# Patient Record
Sex: Male | Born: 1975 | Race: White | Hispanic: No | Marital: Married | Smoking: Never smoker
Health system: Southern US, Community
[De-identification: ages and names within clinical notes are randomized; demographics above are authoritative.]

## PROBLEM LIST (undated history)

## (undated) DIAGNOSIS — E785 Hyperlipidemia, unspecified: Secondary | ICD-10-CM

## (undated) DIAGNOSIS — G4730 Sleep apnea, unspecified: Secondary | ICD-10-CM

## (undated) HISTORY — DX: Sleep apnea, unspecified: G47.30

## (undated) HISTORY — DX: Hyperlipidemia, unspecified: E78.5

## (undated) HISTORY — PX: APPENDECTOMY: SHX54

---

## 2008-01-06 ENCOUNTER — Ambulatory Visit: Payer: Self-pay | Admitting: Family Medicine

## 2008-07-21 ENCOUNTER — Emergency Department: Payer: Self-pay | Admitting: Emergency Medicine

## 2008-07-21 ENCOUNTER — Other Ambulatory Visit: Payer: Self-pay

## 2008-07-26 ENCOUNTER — Ambulatory Visit: Payer: Self-pay | Admitting: Family Medicine

## 2008-07-26 ENCOUNTER — Other Ambulatory Visit: Payer: Self-pay

## 2009-07-16 IMAGING — CR DG CHEST 2V
1 series · 2 of 2 positions shown · non-contrast
Comparison: none

REASON FOR EXAM: Chest tightness
COMMENTS:

PROCEDURE:     MDR - MDR CHEST PA(OR AP) AND LATERAL  - July 26, 2008  [DATE]
RESULT:     The lungs are clear. The cardiac silhouette and visualized bony
skeleton are unremarkable.

[Series 1: view not recorded · 0.17mm/px · 2 of 2 slices shown]
[im 1/2]
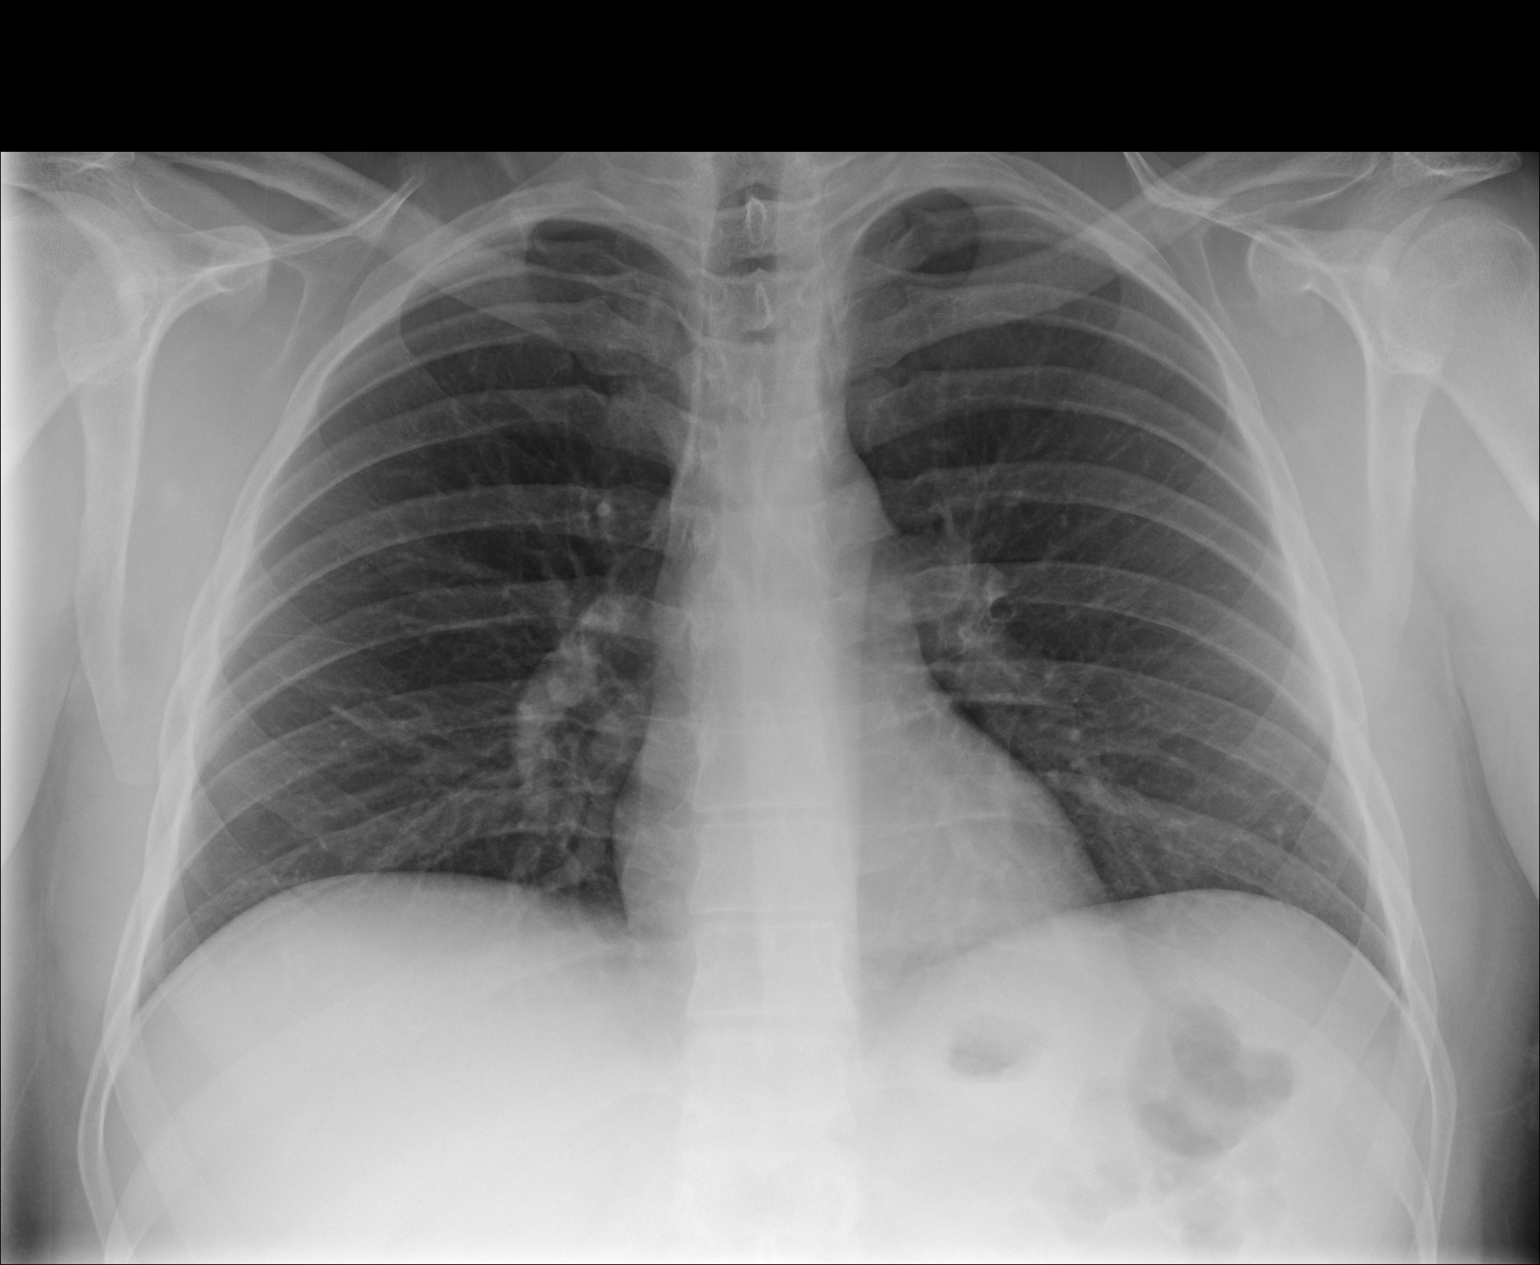
[im 2/2]
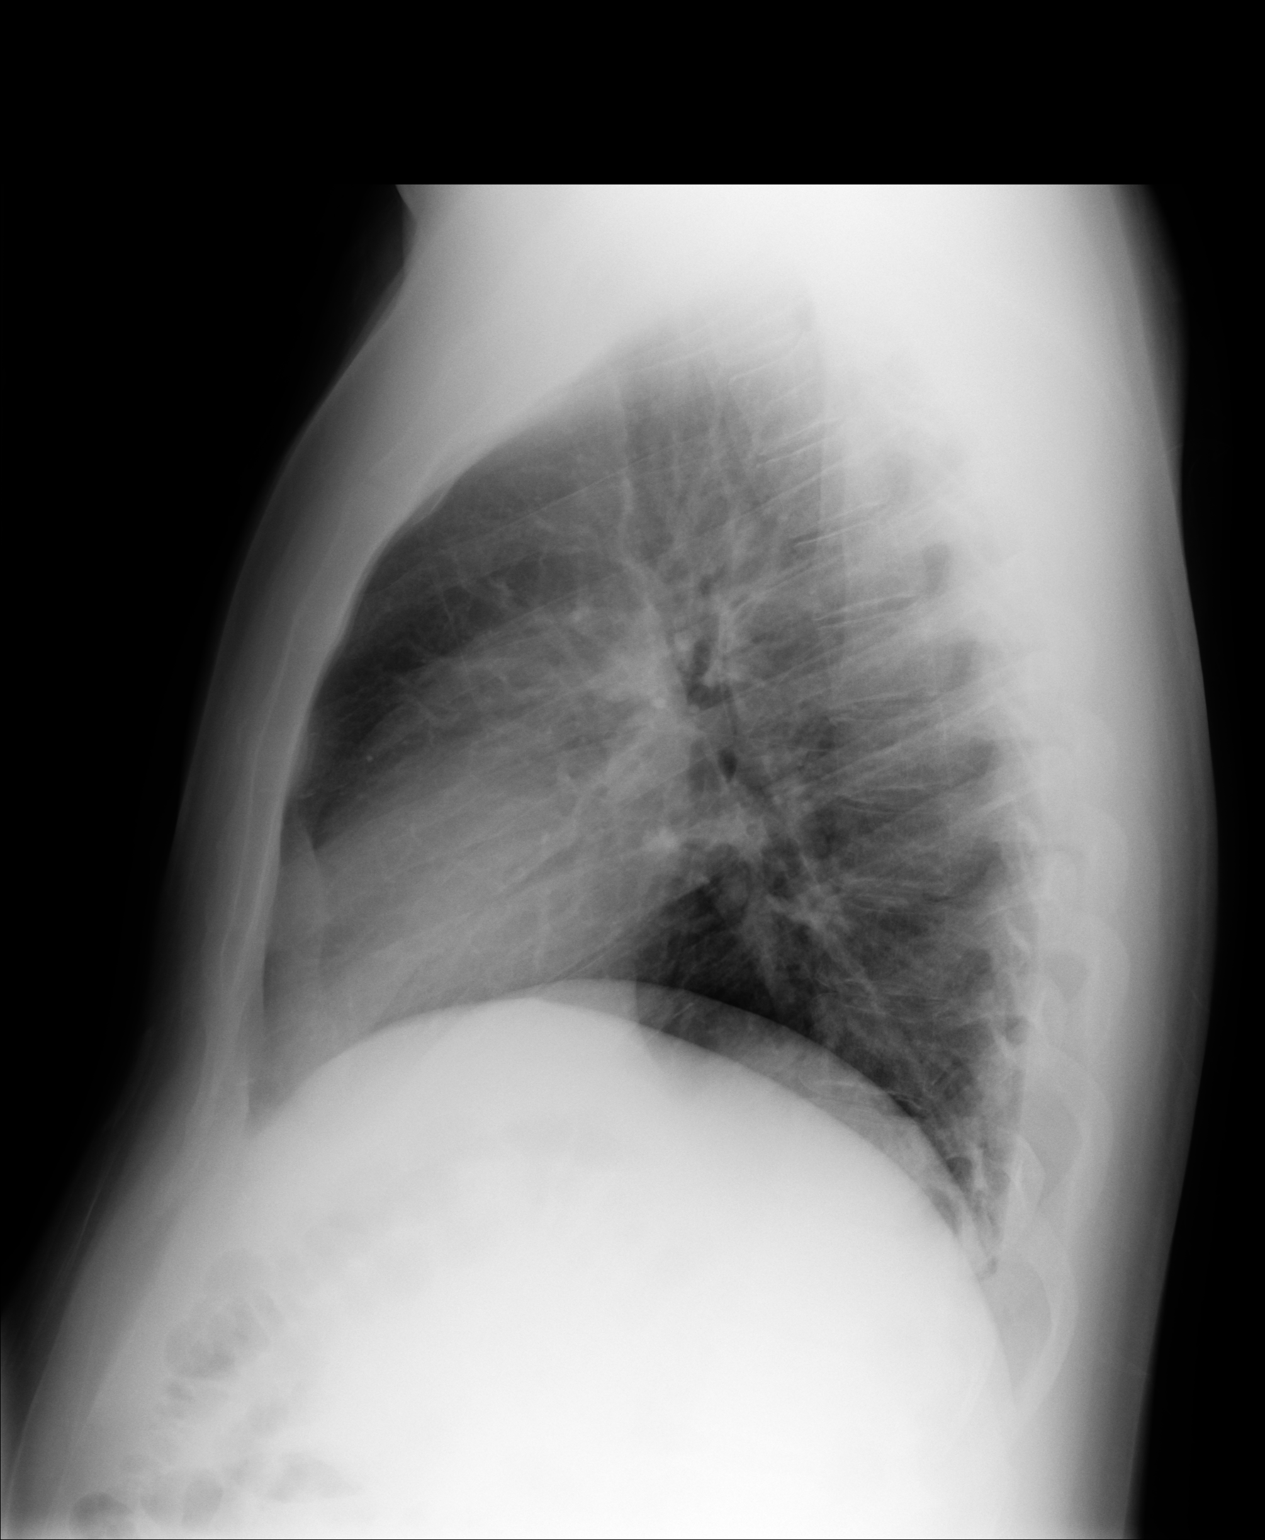

[2 of 2 positions shown; findings below may reference images not displayed]

IMPRESSION: Chest radiograph without evidence of acute cardiopulmonary
disease.

## 2010-09-29 ENCOUNTER — Ambulatory Visit: Payer: Self-pay | Admitting: Internal Medicine

## 2014-10-10 LAB — TSH: TSH: 1.39 u[IU]/mL (ref 0.41–5.90)

## 2014-10-10 LAB — BASIC METABOLIC PANEL
BUN: 12 mg/dL (ref 4–21)
Creatinine: 1 mg/dL (ref 0.6–1.3)

## 2014-10-10 LAB — LIPID PANEL
CHOLESTEROL: 227 mg/dL — AB (ref 0–200)
HDL: 37 mg/dL (ref 35–70)
LDL Cholesterol: 151 mg/dL
TRIGLYCERIDES: 195 mg/dL — AB (ref 40–160)

## 2014-10-10 LAB — CBC AND DIFFERENTIAL: HEMOGLOBIN: 15 g/dL (ref 13.5–17.5)

## 2015-02-25 DIAGNOSIS — G4733 Obstructive sleep apnea (adult) (pediatric): Secondary | ICD-10-CM | POA: Insufficient documentation

## 2015-03-08 ENCOUNTER — Ambulatory Visit: Payer: Self-pay | Admitting: Internal Medicine

## 2015-04-17 ENCOUNTER — Ambulatory Visit: Admit: 2015-04-17 | Disposition: A | Discharge status: Home / Self Care | Payer: Self-pay | Admitting: Internal Medicine

## 2015-12-04 ENCOUNTER — Encounter: Payer: Self-pay | Admitting: Internal Medicine

## 2015-12-04 ENCOUNTER — Other Ambulatory Visit: Payer: Self-pay | Admitting: Internal Medicine

## 2015-12-04 DIAGNOSIS — H6990 Unspecified Eustachian tube disorder, unspecified ear: Secondary | ICD-10-CM | POA: Insufficient documentation

## 2015-12-04 DIAGNOSIS — H6980 Other specified disorders of Eustachian tube, unspecified ear: Secondary | ICD-10-CM | POA: Insufficient documentation

## 2016-12-25 ENCOUNTER — Encounter: Payer: Self-pay | Admitting: Internal Medicine

## 2017-04-26 ENCOUNTER — Encounter: Payer: Self-pay | Admitting: Internal Medicine

## 2017-04-26 DIAGNOSIS — E782 Mixed hyperlipidemia: Secondary | ICD-10-CM | POA: Insufficient documentation

## 2017-04-27 ENCOUNTER — Ambulatory Visit (INDEPENDENT_AMBULATORY_CARE_PROVIDER_SITE_OTHER): Payer: BLUE CROSS/BLUE SHIELD | Admitting: Internal Medicine

## 2017-04-27 ENCOUNTER — Encounter: Payer: Self-pay | Admitting: Internal Medicine

## 2017-04-27 VITALS — BP 122/78 | HR 80 | Ht 68.0 in | Wt 202.4 lb

## 2017-04-27 DIAGNOSIS — Z0000 Encounter for general adult medical examination without abnormal findings: Secondary | ICD-10-CM | POA: Diagnosis not present

## 2017-04-27 DIAGNOSIS — H6980 Other specified disorders of Eustachian tube, unspecified ear: Secondary | ICD-10-CM | POA: Diagnosis not present

## 2017-04-27 DIAGNOSIS — E782 Mixed hyperlipidemia: Secondary | ICD-10-CM | POA: Diagnosis not present

## 2017-04-27 DIAGNOSIS — G4733 Obstructive sleep apnea (adult) (pediatric): Secondary | ICD-10-CM | POA: Diagnosis not present

## 2017-04-27 DIAGNOSIS — B354 Tinea corporis: Secondary | ICD-10-CM | POA: Insufficient documentation

## 2017-04-27 LAB — POC URINALYSIS WITH MICROSCOPIC (NON AUTO)MANUAL RESULT
Bilirubin, UA: NEGATIVE
Glucose, UA: NEGATIVE
Ketones, UA: NEGATIVE
LEUKOCYTES UA: NEGATIVE
Mucus, UA: 0
Nitrite, UA: NEGATIVE
PH UA: 7.5 (ref 5.0–8.0)
Protein, UA: NEGATIVE
RBC: 0 M/uL — AB (ref 4.69–6.13)
UROBILINOGEN UA: 0.2 U/dL
WBC CASTS UA: 0

## 2017-04-27 NOTE — Progress Notes (Signed)
Date:  04/27/2017   Name:  Christian Chavez   DOB:  1976/01/23   MRN:  [id number]   Chief Complaint: Annual Exam Christian Chavez is a 41 y.o. male who presents today for his Complete Annual Exam. He feels well. He reports exercising none. He reports he is sleeping well.   Hyperlipidemia  Pertinent negatives include no chest pain, myalgias or shortness of breath.  Rash  This is a new problem. Pertinent negatives include no diarrhea, fatigue or shortness of breath.  Allergies - ear congestion and nasal congestion due to environmental agents.  Sx well controlled with flonase. OSA - now on auto PAP nightly and doing well. He uses is nightly and feels rested with no more AM headaches.   Review of Systems  Constitutional: Negative for appetite change, chills, diaphoresis, fatigue and unexpected weight change.  HENT: Negative for hearing loss, tinnitus, trouble swallowing and voice change.   Eyes: Negative for visual disturbance.  Respiratory: Negative for choking, shortness of breath and wheezing.   Cardiovascular: Positive for palpitations. Negative for chest pain and leg swelling.  Gastrointestinal: Negative for abdominal pain, blood in stool, constipation and diarrhea.  Genitourinary: Negative for difficulty urinating, dysuria and frequency.  Musculoskeletal: Positive for arthralgias (left ankle). Negative for back pain and myalgias.  Skin: Negative for color change and rash.  Allergic/Immunologic: Negative for environmental allergies and food allergies.  Neurological: Negative for dizziness, syncope and headaches.  Hematological: Negative for adenopathy.  Psychiatric/Behavioral: Negative for dysphoric mood and sleep disturbance.    Patient Active Problem List   Diagnosis Date Noted  . Hyperlipidemia, mixed 04/26/2017  . Dysfunction of eustachian tube 12/04/2015  . OSA (obstructive sleep apnea) 02/25/2015    Prior to Admission medications   Medication Sig Start Date End Date Taking?  Authorizing Provider  fluticasone (FLONASE) 50 MCG/ACT nasal spray Place 2 sprays into the nose daily. 09/11/14  Yes Historical Provider, MD    No Known Allergies  Past Surgical History:  Procedure Laterality Date  . APPENDECTOMY      Social History  Substance Use Topics  . Smoking status: Never Smoker  . Smokeless tobacco: Never Used  . Alcohol use 0.0 oz/week     Comment: ocassional   No flowsheet data found.    Medication list has been reviewed and updated.   Physical Exam  Constitutional: He is oriented to person, place, and time. He appears well-developed and well-nourished.  HENT:  Head: Normocephalic.  Right Ear: Tympanic membrane, external ear and ear canal normal.  Left Ear: Tympanic membrane, external ear and ear canal normal.  Nose: Nose normal.  Mouth/Throat: Uvula is midline and oropharynx is clear and moist.  Eyes: Conjunctivae and EOM are normal. Pupils are equal, round, and reactive to light.  Neck: Normal range of motion. Neck supple. Carotid bruit is not present. No thyromegaly present.  Cardiovascular: Normal rate, regular rhythm, normal heart sounds and intact distal pulses.   Pulmonary/Chest: Effort normal and breath sounds normal. He has no wheezes. Right breast exhibits no mass. Left breast exhibits no mass.  Abdominal: Soft. Normal appearance and bowel sounds are normal. There is no hepatosplenomegaly. There is no tenderness.  Musculoskeletal: Normal range of motion.       Left ankle: He exhibits normal range of motion and no swelling. No tenderness. Achilles tendon normal.  Lymphadenopathy:    He has no cervical adenopathy.  Neurological: He is alert and oriented to person, place, and time. He has normal  reflexes.  Skin: Skin is warm and dry. Rash noted.  Tinea rash in both axilla  Psychiatric: He has a normal mood and affect. His speech is normal and behavior is normal. Judgment and thought content normal.  Nursing note and vitals  reviewed.   BP 122/78 (BP Location: Right Arm, Patient Position: Sitting, Cuff Size: Normal)   Pulse 80   Ht  (1.727 m)   Wt 202 lb 6.4 oz (91.8 kg)   SpO2 98%   BMI 30.77 kg/m   Assessment and Plan: 1. Annual physical exam UA micro negative for RBC - POC urinalysis w microscopic (non auto)  2. Hyperlipidemia, mixed Advise if medication is needed Resume exercise - Lipid panel  3. Dysfunction of Eustachian tube, unspecified laterality Continue flonase  4. Tinea corporis Use topical otc antifungal - Comprehensive metabolic panel  5. OSA (obstructive sleep apnea) Doing well with good compliance - CBC with Differential/Platelet   No orders of the defined types were placed in this encounter.   Bari Edward, MD [facility]  04/27/2017

## 2017-04-27 NOTE — Patient Instructions (Signed)
 Health Maintenance, Male A healthy lifestyle and preventive care is important for your health and wellness. Ask your health care provider about what schedule of regular examinations is right for you. What should I know about weight and diet?  Eat a Healthy Diet  Eat plenty of vegetables, fruits, whole grains, low-fat dairy products, and lean protein.  Do not eat a lot of foods high in solid fats, added sugars, or salt. Maintain a Healthy Weight  Regular exercise can help you achieve or maintain a healthy weight. You should:  Do at least 150 minutes of exercise each week. The exercise should increase your heart rate and make you sweat (moderate-intensity exercise).  Do strength-training exercises at least twice a week. Watch Your Levels of Cholesterol and Blood Lipids  Have your blood tested for lipids and cholesterol every 5 years starting at 41 years of age. If you are at high risk for heart disease, you should start having your blood tested when you are 41 years old. You may need to have your cholesterol levels checked more often if:  Your lipid or cholesterol levels are high.  You are older than 41 years of age.  You are at high risk for heart disease. What should I know about cancer screening? Many types of cancers can be detected early and may often be prevented. Lung Cancer  You should be screened every year for lung cancer if:  You are a current smoker who has smoked for at least 30 years.  You are a former smoker who has quit within the past 15 years.  Talk to your health care provider about your screening options, when you should start screening, and how often you should be screened. Colorectal Cancer  Routine colorectal cancer screening usually begins at 41 years of age and should be repeated every 5-10 years until you are 41 years old. You may need to be screened more often if early forms of precancerous polyps or small growths are found. Your health care provider  may recommend screening at an earlier age if you have risk factors for colon cancer.  Your health care provider may recommend using home test kits to check for hidden blood in the stool.  A small camera at the end of a tube can be used to examine your colon (sigmoidoscopy or colonoscopy). This checks for the earliest forms of colorectal cancer. Prostate and Testicular Cancer  Depending on your age and overall health, your health care provider may do certain tests to screen for prostate and testicular cancer.  Talk to your health care provider about any symptoms or concerns you have about testicular or prostate cancer. Skin Cancer  Check your skin from head to toe regularly.  Tell your health care provider about any new moles or changes in moles, especially if:  There is a change in a mole's size, shape, or color.  You have a mole that is larger than a pencil eraser.  Always use sunscreen. Apply sunscreen liberally and repeat throughout the day.  Protect yourself by wearing long sleeves, pants, a wide-brimmed hat, and sunglasses when outside. What should I know about heart disease, diabetes, and high blood pressure?  If you are 18-39 years of age, have your blood pressure checked every 3-5 years. If you are 40 years of age or older, have your blood pressure checked every year. You should have your blood pressure measured twice-once when you are at a hospital or clinic, and once when you are not at   a hospital or clinic. Record the average of the two measurements. To check your blood pressure when you are not at a hospital or clinic, you can use:  An automated blood pressure machine at a pharmacy.  A home blood pressure monitor.  Talk to your health care provider about your target blood pressure.  If you are between 45-79 years old, ask your health care provider if you should take aspirin to prevent heart disease.  Have regular diabetes screenings by checking your fasting blood sugar  level.  If you are at a normal weight and have a low risk for diabetes, have this test once every three years after the age of 45.  If you are overweight and have a high risk for diabetes, consider being tested at a younger age or more often.  A one-time screening for abdominal aortic aneurysm (AAA) by ultrasound is recommended for men aged 65-75 years who are current or former smokers. What should I know about preventing infection? Hepatitis B  If you have a higher risk for hepatitis B, you should be screened for this virus. Talk with your health care provider to find out if you are at risk for hepatitis B infection. Hepatitis C  Blood testing is recommended for:  Everyone born from 1945 through 1965.  Anyone with known risk factors for hepatitis C. Sexually Transmitted Diseases (STDs)  You should be screened each year for STDs including gonorrhea and chlamydia if:  You are sexually active and are younger than 41 years of age.  You are older than 41 years of age and your health care provider tells you that you are at risk for this type of infection.  Your sexual activity has changed since you were last screened and you are at an increased risk for chlamydia or gonorrhea. Ask your health care provider if you are at risk.  Talk with your health care provider about whether you are at high risk of being infected with HIV. Your health care provider may recommend a prescription medicine to help prevent HIV infection. What else can I do?  Schedule regular health, dental, and eye exams.  Stay current with your vaccines (immunizations).  Do not use any tobacco products, such as cigarettes, chewing tobacco, and e-cigarettes. If you need help quitting, ask your health care provider.  Limit alcohol intake to no more than 2 drinks per day. One drink equals 12 ounces of beer, 5 ounces of wine, or 1 ounces of hard liquor.  Do not use street drugs.  Do not share needles.  Ask your health  care provider for help if you need support or information about quitting drugs.  Tell your health care provider if you often feel depressed.  Tell your health care provider if you have ever been abused or do not feel safe at home. This information is not intended to replace advice given to you by your health care provider. Make sure you discuss any questions you have with your health care provider. Document Released: 06/11/2008 Document Revised: 08/12/2016 Document Reviewed: 09/17/2015 Elsevier Interactive Patient Education  2017 Elsevier Inc[employer]  

## 2017-04-28 LAB — COMPREHENSIVE METABOLIC PANEL
ALBUMIN: 4.5 g/dL (ref 3.5–5.5)
ALK PHOS: 83 IU/L (ref 39–117)
ALT: 21 IU/L (ref 0–44)
AST: 17 IU/L (ref 0–40)
Albumin/Globulin Ratio: 1.9 (ref 1.2–2.2)
BILIRUBIN TOTAL: 0.4 mg/dL (ref 0.0–1.2)
BUN/Creatinine Ratio: 11 (ref 9–20)
BUN: 9 mg/dL (ref 6–24)
CHLORIDE: 104 mmol/L (ref 96–106)
CO2: 25 mmol/L (ref 18–29)
Calcium: 9.6 mg/dL (ref 8.7–10.2)
Creatinine, Ser: 0.83 mg/dL (ref 0.76–1.27)
GFR calc Af Amer: 126 mL/min/{1.73_m2} (ref >59)
GFR calc non Af Amer: 109 mL/min/{1.73_m2} (ref >59)
GLUCOSE: 100 mg/dL — AB (ref 65–99)
Globulin, Total: 2.4 g/dL (ref 1.5–4.5)
POTASSIUM: 4.3 mmol/L (ref 3.5–5.2)
SODIUM: 144 mmol/L (ref 134–144)
Total Protein: 6.9 g/dL (ref 6.0–8.5)

## 2017-04-28 LAB — LIPID PANEL
CHOL/HDL RATIO: 4.3 ratio (ref 0.0–5.0)
Cholesterol, Total: 215 mg/dL — ABNORMAL HIGH (ref 100–199)
HDL: 50 mg/dL (ref >39)
LDL CALC: 144 mg/dL — AB (ref 0–99)
TRIGLYCERIDES: 103 mg/dL (ref 0–149)
VLDL Cholesterol Cal: 21 mg/dL (ref 5–40)

## 2017-04-28 LAB — CBC WITH DIFFERENTIAL/PLATELET
BASOS ABS: 0 10*3/uL (ref 0.0–0.2)
Basos: 0 %
EOS (ABSOLUTE): 0.2 10*3/uL (ref 0.0–0.4)
Eos: 3 %
Hematocrit: 42.5 % (ref 37.5–51.0)
Hemoglobin: 14.2 g/dL (ref 13.0–17.7)
Immature Grans (Abs): 0 10*3/uL (ref 0.0–0.1)
Immature Granulocytes: 0 %
Lymphocytes Absolute: 1.9 10*3/uL (ref 0.7–3.1)
Lymphs: 38 %
MCH: 29.3 pg (ref 26.6–33.0)
MCHC: 33.4 g/dL (ref 31.5–35.7)
MCV: 88 fL (ref 79–97)
Monocytes Absolute: 0.6 10*3/uL (ref 0.1–0.9)
Monocytes: 11 %
Neutrophils Absolute: 2.3 10*3/uL (ref 1.4–7.0)
Neutrophils: 48 %
Platelets: 284 10*3/uL (ref 150–379)
RBC: 4.84 x10E6/uL (ref 4.14–5.80)
RDW: 13.6 % (ref 12.3–15.4)
WBC: 4.8 10*3/uL (ref 3.4–10.8)

## 2018-05-10 ENCOUNTER — Encounter: Payer: BLUE CROSS/BLUE SHIELD | Admitting: Internal Medicine

## 2018-09-20 ENCOUNTER — Ambulatory Visit (INDEPENDENT_AMBULATORY_CARE_PROVIDER_SITE_OTHER): Payer: BLUE CROSS/BLUE SHIELD | Admitting: Internal Medicine

## 2018-09-20 ENCOUNTER — Encounter: Payer: Self-pay | Admitting: Internal Medicine

## 2018-09-20 VITALS — BP 118/76 | HR 67 | Ht 68.0 in | Wt 204.8 lb

## 2018-09-20 DIAGNOSIS — E782 Mixed hyperlipidemia: Secondary | ICD-10-CM

## 2018-09-20 DIAGNOSIS — Z0000 Encounter for general adult medical examination without abnormal findings: Secondary | ICD-10-CM | POA: Diagnosis not present

## 2018-09-20 DIAGNOSIS — G4733 Obstructive sleep apnea (adult) (pediatric): Secondary | ICD-10-CM | POA: Diagnosis not present

## 2018-09-20 DIAGNOSIS — R002 Palpitations: Secondary | ICD-10-CM

## 2018-09-20 DIAGNOSIS — H6980 Other specified disorders of Eustachian tube, unspecified ear: Secondary | ICD-10-CM | POA: Diagnosis not present

## 2018-09-20 LAB — POCT URINALYSIS DIPSTICK
BILIRUBIN UA: NEGATIVE
GLUCOSE UA: NEGATIVE
KETONES UA: NEGATIVE
Leukocytes, UA: NEGATIVE
Nitrite, UA: NEGATIVE
ODOR: NEGATIVE
PH UA: 6 (ref 5.0–8.0)
Protein, UA: POSITIVE — AB
Spec Grav, UA: 1.02 (ref 1.010–1.025)
UROBILINOGEN UA: 0.2 U/dL

## 2018-09-20 NOTE — Progress Notes (Signed)
Date:  09/20/2018   Name:  Christian Chavez   DOB:  03/13/1976   MRN:  [id number]   Chief Complaint: Annual Exam Christian Chavez is a 42 y.o. male who presents today for his Complete Annual Exam. He feels well. He reports exercising some but walks all day at work. He reports he is sleeping well using CPAP nightly.   Palpitations   This is a chronic problem. The problem occurs rarely. Progression since onset: much improved since being on CPAP. Pertinent negatives include no chest pain, diaphoresis, dizziness, shortness of breath or weakness. The treatment provided significant relief.   OSA - on CPAP every night, feels well rested with good energy.  No sleeping during the day, no morning headaches.    Review of Systems  Constitutional: Negative for appetite change, chills, diaphoresis, fatigue and unexpected weight change.  HENT: Positive for ear pain (intermittent eustachian tube dysfunction treated with flonase PRN). Negative for hearing loss, tinnitus, trouble swallowing and voice change.   Eyes: Negative for visual disturbance.  Respiratory: Negative for choking, shortness of breath and wheezing.   Cardiovascular: Positive for palpitations (intermittently). Negative for chest pain and leg swelling.  Gastrointestinal: Negative for abdominal pain, blood in stool, constipation and diarrhea.  Genitourinary: Negative for difficulty urinating, dysuria and frequency.       Nocturia x 1  Musculoskeletal: Negative for arthralgias, back pain and myalgias.  Skin: Negative for color change and rash.  Allergic/Immunologic: Positive for environmental allergies.  Neurological: Negative for dizziness, syncope, weakness and headaches.  Hematological: Negative for adenopathy.  Psychiatric/Behavioral: Negative for dysphoric mood and sleep disturbance.    Patient Active Problem List   Diagnosis Date Noted  . Tinea corporis 04/27/2017  . Hyperlipidemia, mixed 04/26/2017  . Dysfunction of eustachian tube  12/04/2015  . OSA (obstructive sleep apnea) 02/25/2015    No Known Allergies  Past Surgical History:  Procedure Laterality Date  . APPENDECTOMY      Social History   Tobacco Use  . Smoking status: Never Smoker  . Smokeless tobacco: Never Used  Substance Use Topics  . Alcohol use: Yes    Alcohol/week: 0.0 standard drinks    Comment: ocassional  . Drug use: No     Medication list has been reviewed and updated.  Current Meds  Medication Sig  . fluticasone (FLONASE) 50 MCG/ACT nasal spray Place 2 sprays into the nose daily.  . NON FORMULARY CPAP auto nightly    PHQ 2/9 Scores 09/20/2018  PHQ - 2 Score 0    Physical Exam  Constitutional: He is oriented to person, place, and time. He appears well-developed and well-nourished.  HENT:  Head: Normocephalic.  Right Ear: Tympanic membrane, external ear and ear canal normal. Tympanic membrane is not erythematous and not retracted.  Left Ear: Tympanic membrane, external ear and ear canal normal. Tympanic membrane is not erythematous and not retracted.  Nose: Nose normal.  Mouth/Throat: Uvula is midline, oropharynx is clear and moist and mucous membranes are normal.  Eyes: Pupils are equal, round, and reactive to light. Conjunctivae and EOM are normal.  Neck: Normal range of motion. Neck supple. Carotid bruit is not present. No thyromegaly present.  Cardiovascular: Normal rate, regular rhythm, normal heart sounds and intact distal pulses.  Pulmonary/Chest: Effort normal and breath sounds normal. He has no wheezes. Right breast exhibits no mass. Left breast exhibits no mass.  Abdominal: Soft. Normal appearance and bowel sounds are normal. There is no hepatosplenomegaly. There is no  tenderness.  Musculoskeletal: Normal range of motion.  Lymphadenopathy:    He has no cervical adenopathy.  Neurological: He is alert and oriented to person, place, and time. He has normal reflexes.  Skin: Skin is warm, dry and intact.  Psychiatric: He  has a normal mood and affect. His speech is normal and behavior is normal. Judgment and thought content normal.  Nursing note and vitals reviewed.   BP 118/76 (BP Location: Right Arm, Patient Position: Sitting, Cuff Size: Normal)   Pulse 67   Ht 5\' 8"  (1.727 m)   Wt 204 lb 12.8 oz (92.9 kg)   SpO2 97%   BMI 31.14 kg/m   Assessment and Plan: 1. Annual physical exam Recommend regular aerobic exercise - POCT urinalysis dipstick - dipstick positive for blood but micro negative  2. OSA (obstructive sleep apnea) Continue CPAP nightly - CBC with Differential/Platelet - Comprehensive metabolic panel  3. Dysfunction of Eustachian tube, unspecified laterality flonase PRN  4. Hyperlipidemia, mixed Check labs and advise - Lipid panel  5. Intermittent palpitations asx and do not need treatment other than CPAP now - TSH   Partially dictated using Animal nutritionistDragon software. Any errors are unintentional.  Bari EdwardLaura Terrace Fontanilla, MD [facility]  09/20/2018

## 2018-09-21 LAB — COMPREHENSIVE METABOLIC PANEL
ALBUMIN: 4.9 g/dL (ref 3.5–5.5)
ALK PHOS: 94 IU/L (ref 39–117)
ALT: 22 IU/L (ref 0–44)
AST: 19 IU/L (ref 0–40)
Albumin/Globulin Ratio: 2.5 — ABNORMAL HIGH (ref 1.2–2.2)
BUN/Creatinine Ratio: 15 (ref 9–20)
BUN: 12 mg/dL (ref 6–24)
Bilirubin Total: 0.3 mg/dL (ref 0.0–1.2)
CO2: 21 mmol/L (ref 20–29)
CREATININE: 0.79 mg/dL (ref 0.76–1.27)
Calcium: 9.1 mg/dL (ref 8.7–10.2)
Chloride: 105 mmol/L (ref 96–106)
GFR, EST AFRICAN AMERICAN: 128 mL/min/{1.73_m2} (ref >59)
GFR, EST NON AFRICAN AMERICAN: 111 mL/min/{1.73_m2} (ref >59)
Globulin, Total: 2 g/dL (ref 1.5–4.5)
Glucose: 91 mg/dL (ref 65–99)
Potassium: 4.2 mmol/L (ref 3.5–5.2)
SODIUM: 143 mmol/L (ref 134–144)
Total Protein: 6.9 g/dL (ref 6.0–8.5)

## 2018-09-21 LAB — CBC WITH DIFFERENTIAL/PLATELET
Basophils Absolute: 0 10*3/uL (ref 0.0–0.2)
Basos: 0 %
EOS (ABSOLUTE): 0.3 10*3/uL (ref 0.0–0.4)
Eos: 5 %
Hematocrit: 42.4 % (ref 37.5–51.0)
Hemoglobin: 14.4 g/dL (ref 13.0–17.7)
Immature Grans (Abs): 0 10*3/uL (ref 0.0–0.1)
Immature Granulocytes: 0 %
LYMPHS ABS: 1.6 10*3/uL (ref 0.7–3.1)
Lymphs: 30 %
MCH: 29.4 pg (ref 26.6–33.0)
MCHC: 34 g/dL (ref 31.5–35.7)
MCV: 87 fL (ref 79–97)
MONOCYTES: 10 %
Monocytes Absolute: 0.6 10*3/uL (ref 0.1–0.9)
Neutrophils Absolute: 2.9 10*3/uL (ref 1.4–7.0)
Neutrophils: 55 %
Platelets: 274 10*3/uL (ref 150–450)
RBC: 4.9 x10E6/uL (ref 4.14–5.80)
RDW: 12.8 % (ref 12.3–15.4)
WBC: 5.3 10*3/uL (ref 3.4–10.8)

## 2018-09-21 LAB — LIPID PANEL
Chol/HDL Ratio: 5.1 ratio — ABNORMAL HIGH (ref 0.0–5.0)
Cholesterol, Total: 235 mg/dL — ABNORMAL HIGH (ref 100–199)
HDL: 46 mg/dL (ref >39)
LDL CALC: 152 mg/dL — AB (ref 0–99)
Triglycerides: 186 mg/dL — ABNORMAL HIGH (ref 0–149)
VLDL Cholesterol Cal: 37 mg/dL (ref 5–40)

## 2018-09-21 LAB — TSH: TSH: 1.33 u[IU]/mL (ref 0.450–4.500)

## 2018-11-23 DIAGNOSIS — G4733 Obstructive sleep apnea (adult) (pediatric): Secondary | ICD-10-CM | POA: Diagnosis not present

## 2019-05-23 DIAGNOSIS — G4733 Obstructive sleep apnea (adult) (pediatric): Secondary | ICD-10-CM | POA: Diagnosis not present

## 2019-09-01 DIAGNOSIS — G4733 Obstructive sleep apnea (adult) (pediatric): Secondary | ICD-10-CM | POA: Diagnosis not present

## 2019-10-18 ENCOUNTER — Ambulatory Visit (INDEPENDENT_AMBULATORY_CARE_PROVIDER_SITE_OTHER): Payer: BC Managed Care – PPO | Admitting: Internal Medicine

## 2019-10-18 ENCOUNTER — Other Ambulatory Visit: Payer: Self-pay

## 2019-10-18 ENCOUNTER — Encounter: Payer: Self-pay | Admitting: Internal Medicine

## 2019-10-18 VITALS — BP 104/66 | HR 78 | Ht 68.0 in | Wt 199.0 lb

## 2019-10-18 DIAGNOSIS — Z0000 Encounter for general adult medical examination without abnormal findings: Secondary | ICD-10-CM | POA: Diagnosis not present

## 2019-10-18 DIAGNOSIS — E782 Mixed hyperlipidemia: Secondary | ICD-10-CM | POA: Diagnosis not present

## 2019-10-18 DIAGNOSIS — Z23 Encounter for immunization: Secondary | ICD-10-CM | POA: Diagnosis not present

## 2019-10-18 DIAGNOSIS — G4733 Obstructive sleep apnea (adult) (pediatric): Secondary | ICD-10-CM | POA: Diagnosis not present

## 2019-10-18 LAB — POCT URINALYSIS DIPSTICK
Bilirubin, UA: NEGATIVE
Glucose, UA: NEGATIVE
Ketones, UA: NEGATIVE
Leukocytes, UA: NEGATIVE
Nitrite, UA: NEGATIVE
Protein, UA: NEGATIVE
Spec Grav, UA: 1.01 (ref 1.010–1.025)
Urobilinogen, UA: 0.2 E.U./dL
pH, UA: 6 (ref 5.0–8.0)

## 2019-10-18 NOTE — Progress Notes (Signed)
Date:  10/18/2019   Name:  Christian Chavez   DOB:  [birth year]   MRN:  [id number]   Chief Complaint: Annual Exam Christian Chavez is a 43 y.o. male who presents today for his Complete Annual Exam. He feels well. He reports exercising walking three times a week. He reports he is sleeping fairly well.   HPI OSA - treated with CPAP nightly. Pt reports good compliance and refreshing sleep.  No AM headaches, elevated BP or daytime somnolence.  Review of Systems  Constitutional: Negative for appetite change, chills, diaphoresis, fatigue and unexpected weight change.  HENT: Negative for hearing loss, tinnitus, trouble swallowing and voice change.   Eyes: Negative for visual disturbance.  Respiratory: Negative for choking, shortness of breath and wheezing.   Cardiovascular: Positive for palpitations (unchanged). Negative for chest pain and leg swelling.  Gastrointestinal: Negative for abdominal pain, blood in stool, constipation and diarrhea.  Genitourinary: Negative for difficulty urinating, dysuria and frequency.  Musculoskeletal: Negative for arthralgias, back pain and myalgias.  Skin: Negative for color change and rash.  Allergic/Immunologic: Positive for environmental allergies.  Neurological: Negative for dizziness, syncope and headaches.  Hematological: Negative for adenopathy.  Psychiatric/Behavioral: Positive for sleep disturbance. Negative for dysphoric mood.    Patient Active Problem List   Diagnosis Date Noted  . Intermittent palpitations 09/20/2018  . Tinea corporis 04/27/2017  . Hyperlipidemia, mixed 04/26/2017  . Dysfunction of eustachian tube 12/04/2015  . OSA (obstructive sleep apnea) 02/25/2015    No Known Allergies  Past Surgical History:  Procedure Laterality Date  . APPENDECTOMY      Social History   Tobacco Use  . Smoking status: Never Smoker  . Smokeless tobacco: Never Used  Substance Use Topics  . Alcohol use: Yes    Alcohol/week: 0.0 standard drinks   Comment: ocassional  . Drug use: No     Medication list has been reviewed and updated.  Current Meds  Medication Sig  . fluticasone (FLONASE) 50 MCG/ACT nasal spray Place 2 sprays into the nose daily.  . NON FORMULARY CPAP auto nightly    PHQ 2/9 Scores 10/18/2019 09/20/2018  PHQ - 2 Score 0 0    BP Readings from Last 3 Encounters:  10/18/19 104/66  09/20/18 118/76  04/27/17 122/78    Physical Exam Vitals signs and nursing note reviewed.  Constitutional:      Appearance: Normal appearance. He is well-developed.  HENT:     Head: Normocephalic.     Right Ear: Tympanic membrane, ear canal and external ear normal.     Left Ear: Tympanic membrane, ear canal and external ear normal.     Nose: Nose normal.     Mouth/Throat:     Pharynx: Uvula midline.  Eyes:     Conjunctiva/sclera: Conjunctivae normal.     Pupils: Pupils are equal, round, and reactive to light.  Neck:     Musculoskeletal: Normal range of motion and neck supple.     Thyroid: No thyromegaly.     Vascular: No carotid bruit.  Cardiovascular:     Rate and Rhythm: Normal rate and regular rhythm.     Heart sounds: Normal heart sounds.  Pulmonary:     Effort: Pulmonary effort is normal.     Breath sounds: Normal breath sounds. No wheezing.  Chest:     Breasts:        Right: No mass.        Left: No mass.  Abdominal:  General: Bowel sounds are normal.     Palpations: Abdomen is soft.     Tenderness: There is no abdominal tenderness.  Musculoskeletal: Normal range of motion.     Right lower leg: No edema.     Left lower leg: No edema.  Lymphadenopathy:     Cervical: No cervical adenopathy.  Skin:    General: Skin is warm and dry.     Capillary Refill: Capillary refill takes less than 2 seconds.  Neurological:     General: No focal deficit present.     Mental Status: He is alert and oriented to person, place, and time.     Deep Tendon Reflexes: Reflexes are normal and symmetric.  Psychiatric:         Attention and Perception: Attention normal.        Mood and Affect: Mood normal.        Speech: Speech normal.        Behavior: Behavior normal.        Thought Content: Thought content normal.        Judgment: Judgment normal.     Wt Readings from Last 3 Encounters:  10/18/19 199 lb (90.3 kg)  09/20/18 204 lb 12.8 oz (92.9 kg)  04/27/17 202 lb 6.4 oz (91.8 kg)    BP 104/66   Pulse 78   Ht 5\' 8"  (1.727 m)   Wt 199 lb (90.3 kg)   SpO2 97%   BMI 30.26 kg/m   Assessment and Plan: 1. Annual physical exam Normal exam Continue healthy diet and regular exercise - CBC with Differential/Platelet - Comprehensive metabolic panel - POCT urinalysis dipstick  2. OSA (obstructive sleep apnea) Excellent compliance with CPAP without concerns  3. Hyperlipidemia, mixed Check labs and advise Previous 10 yr risk low @ 1.9% - Lipid panel  4. Need for diphtheria-tetanus-pertussis (Tdap) vaccine - Tdap vaccine greater than or equal to 7yo IM   Partially dictated using . Any errors are unintentional.  Animal nutritionist, MD [facility]  10/18/2019

## 2019-10-19 LAB — CBC WITH DIFFERENTIAL/PLATELET
Basophils Absolute: 0 10*3/uL (ref 0.0–0.2)
Basos: 0 %
EOS (ABSOLUTE): 0.2 10*3/uL (ref 0.0–0.4)
Eos: 3 %
Hematocrit: 44.4 % (ref 37.5–51.0)
Hemoglobin: 15.3 g/dL (ref 13.0–17.7)
Immature Grans (Abs): 0 10*3/uL (ref 0.0–0.1)
Immature Granulocytes: 0 %
Lymphocytes Absolute: 1.8 10*3/uL (ref 0.7–3.1)
Lymphs: 33 %
MCH: 30.3 pg (ref 26.6–33.0)
MCHC: 34.5 g/dL (ref 31.5–35.7)
MCV: 88 fL (ref 79–97)
Monocytes Absolute: 0.6 10*3/uL (ref 0.1–0.9)
Monocytes: 11 %
Neutrophils Absolute: 2.9 10*3/uL (ref 1.4–7.0)
Neutrophils: 53 %
Platelets: 278 10*3/uL (ref 150–450)
RBC: 5.05 x10E6/uL (ref 4.14–5.80)
RDW: 12.8 % (ref 11.6–15.4)
WBC: 5.5 10*3/uL (ref 3.4–10.8)

## 2019-10-19 LAB — COMPREHENSIVE METABOLIC PANEL
ALT: 34 IU/L (ref 0–44)
AST: 28 IU/L (ref 0–40)
Albumin/Globulin Ratio: 2.5 — ABNORMAL HIGH (ref 1.2–2.2)
Albumin: 4.7 g/dL (ref 4.0–5.0)
Alkaline Phosphatase: 102 IU/L (ref 39–117)
BUN/Creatinine Ratio: 11 (ref 9–20)
BUN: 10 mg/dL (ref 6–24)
Bilirubin Total: 0.4 mg/dL (ref 0.0–1.2)
CO2: 23 mmol/L (ref 20–29)
Calcium: 9.1 mg/dL (ref 8.7–10.2)
Chloride: 103 mmol/L (ref 96–106)
Creatinine, Ser: 0.94 mg/dL (ref 0.76–1.27)
GFR calc Af Amer: 114 mL/min/{1.73_m2} (ref >59)
GFR calc non Af Amer: 99 mL/min/{1.73_m2} (ref >59)
Globulin, Total: 1.9 g/dL (ref 1.5–4.5)
Glucose: 111 mg/dL — ABNORMAL HIGH (ref 65–99)
Potassium: 4.6 mmol/L (ref 3.5–5.2)
Sodium: 138 mmol/L (ref 134–144)
Total Protein: 6.6 g/dL (ref 6.0–8.5)

## 2019-10-19 LAB — LIPID PANEL
Chol/HDL Ratio: 4.2 ratio (ref 0.0–5.0)
Cholesterol, Total: 242 mg/dL — ABNORMAL HIGH (ref 100–199)
HDL: 58 mg/dL (ref >39)
LDL Chol Calc (NIH): 169 mg/dL — ABNORMAL HIGH (ref 0–99)
Triglycerides: 86 mg/dL (ref 0–149)
VLDL Cholesterol Cal: 15 mg/dL (ref 5–40)

## 2019-12-12 DIAGNOSIS — Z20828 Contact with and (suspected) exposure to other viral communicable diseases: Secondary | ICD-10-CM | POA: Diagnosis not present

## 2020-06-21 DIAGNOSIS — Z20822 Contact with and (suspected) exposure to covid-19: Secondary | ICD-10-CM | POA: Diagnosis not present

## 2020-06-21 DIAGNOSIS — Z03818 Encounter for observation for suspected exposure to other biological agents ruled out: Secondary | ICD-10-CM | POA: Diagnosis not present

## 2020-09-26 DIAGNOSIS — Z20822 Contact with and (suspected) exposure to covid-19: Secondary | ICD-10-CM | POA: Diagnosis not present

## 2020-10-22 ENCOUNTER — Encounter: Payer: BC Managed Care – PPO | Admitting: Internal Medicine

## 2020-11-01 DIAGNOSIS — Z20822 Contact with and (suspected) exposure to covid-19: Secondary | ICD-10-CM | POA: Diagnosis not present

## 2021-01-06 ENCOUNTER — Ambulatory Visit (INDEPENDENT_AMBULATORY_CARE_PROVIDER_SITE_OTHER): Payer: No Typology Code available for payment source | Admitting: Internal Medicine

## 2021-01-06 ENCOUNTER — Other Ambulatory Visit: Payer: Self-pay

## 2021-01-06 ENCOUNTER — Encounter: Payer: Self-pay | Admitting: Internal Medicine

## 2021-01-06 VITALS — BP 120/82 | HR 72 | Temp 97.9°F | Ht 68.0 in | Wt 224.0 lb

## 2021-01-06 DIAGNOSIS — G4733 Obstructive sleep apnea (adult) (pediatric): Secondary | ICD-10-CM | POA: Diagnosis not present

## 2021-01-06 DIAGNOSIS — Z1159 Encounter for screening for other viral diseases: Secondary | ICD-10-CM

## 2021-01-06 DIAGNOSIS — Z0000 Encounter for general adult medical examination without abnormal findings: Secondary | ICD-10-CM | POA: Diagnosis not present

## 2021-01-06 DIAGNOSIS — G8929 Other chronic pain: Secondary | ICD-10-CM

## 2021-01-06 DIAGNOSIS — M25511 Pain in right shoulder: Secondary | ICD-10-CM

## 2021-01-06 NOTE — Progress Notes (Signed)
Date:  01/06/2021   Name:  Christian Chavez   DOB:  08/31/1976   MRN:  [id number]   Chief Complaint: Annual Exam  Christian Chavez is a 45 y.o. male who presents today for his Complete Annual Exam. He feels well. He reports exercising - walking 3-4 x weekly. He reports he is sleeping well.   Colonoscopy: not due; no family hx  Immunization History  Administered Date(s) Administered  . Influenza-Unspecified 09/17/2020  . PFIZER SARS-COV-2 Vaccination 03/27/2020, 04/17/2020, 12/24/2020  . Tdap 10/18/2019   Shoulder Pain  The pain is present in the right shoulder. This is a new problem. Episode onset: fell 6 months ago. The problem occurs constantly. The problem has been gradually improving.    OSA - on Cpap nightly. He rests well, wakes refreshed.  He travels frequently but takes the Cpap along.  Lab Results  Component Value Date   CREATININE 0.94 10/18/2019   BUN 10 10/18/2019   NA 138 10/18/2019   K 4.6 10/18/2019   CL 103 10/18/2019   CO2 23 10/18/2019   Lab Results  Component Value Date   CHOL 242 (H) 10/18/2019   HDL 58 10/18/2019   LDLCALC 169 (H) 10/18/2019   TRIG 86 10/18/2019   CHOLHDL 4.2 10/18/2019   Lab Results  Component Value Date   TSH 1.330 09/20/2018   No results found for: HGBA1C Lab Results  Component Value Date   WBC 5.5 10/18/2019   HGB 15.3 10/18/2019   HCT 44.4 10/18/2019   MCV 88 10/18/2019   PLT 278 10/18/2019   Lab Results  Component Value Date   ALT 34 10/18/2019   AST 28 10/18/2019   ALKPHOS 102 10/18/2019   BILITOT 0.4 10/18/2019     Review of Systems  Constitutional: Negative for appetite change, chills, diaphoresis, fatigue and unexpected weight change.  HENT: Negative for hearing loss, tinnitus, trouble swallowing and voice change.   Eyes: Negative for visual disturbance.  Respiratory: Negative for choking, shortness of breath and wheezing.   Cardiovascular: Negative for chest pain, palpitations and leg swelling.   Gastrointestinal: Negative for abdominal pain, blood in stool, constipation and diarrhea.  Genitourinary: Negative for difficulty urinating, dysuria and frequency.  Musculoskeletal: Positive for arthralgias (shoulder pain on right s/p fall last year). Negative for back pain and myalgias.  Skin: Negative for color change and rash.  Neurological: Negative for dizziness, focal weakness, syncope and headaches.  Hematological: Negative for adenopathy.  Psychiatric/Behavioral: Negative for dysphoric mood and sleep disturbance.    Patient Active Problem List   Diagnosis Date Noted  . Intermittent palpitations 09/20/2018  . Tinea corporis 04/27/2017  . Hyperlipidemia, mixed 04/26/2017  . Dysfunction of eustachian tube 12/04/2015  . OSA (obstructive sleep apnea) 02/25/2015    No Known Allergies  Past Surgical History:  Procedure Laterality Date  . APPENDECTOMY      Social History   Tobacco Use  . Smoking status: Never Smoker  . Smokeless tobacco: Never Used  Vaping Use  . Vaping Use: Never used  Substance Use Topics  . Alcohol use: Yes    Alcohol/week: 0.0 standard drinks    Comment: ocassional  . Drug use: No     Medication list has been reviewed and updated.  Current Meds  Medication Sig  . fluticasone (FLONASE) 50 MCG/ACT nasal spray Place 2 sprays into the nose daily.  . NON FORMULARY CPAP auto nightly    PHQ 2/9 Scores 01/06/2021 10/18/2019 09/20/2018  PHQ -  2 Score 2 0 0  PHQ- 9 Score 2 - -    GAD 7 : Generalized Anxiety Score 01/06/2021  Nervous, Anxious, on Edge 0  Control/stop worrying 0  Worry too much - different things 0  Trouble relaxing 0  Restless 0  Easily annoyed or irritable 0  Afraid - awful might happen 0  Total GAD 7 Score 0  Anxiety Difficulty Not difficult at all    BP Readings from Last 3 Encounters:  01/06/21 120/82  10/18/19 104/66  09/20/18 118/76    Physical Exam Vitals and nursing note reviewed.  Constitutional:       Appearance: Normal appearance. He is well-developed.  HENT:     Head: Normocephalic.     Right Ear: Tympanic membrane, ear canal and external ear normal.     Left Ear: Tympanic membrane, ear canal and external ear normal.     Nose: Nose normal.  Eyes:     Conjunctiva/sclera: Conjunctivae normal.     Pupils: Pupils are equal, round, and reactive to light.  Neck:     Thyroid: No thyromegaly.     Vascular: No carotid bruit.  Cardiovascular:     Rate and Rhythm: Normal rate and regular rhythm.     Heart sounds: Normal heart sounds.  Pulmonary:     Effort: Pulmonary effort is normal.     Breath sounds: Normal breath sounds. No wheezing.  Chest:  Breasts:     Right: No mass.     Left: No mass.    Abdominal:     General: Bowel sounds are normal.     Palpations: Abdomen is soft.     Tenderness: There is no abdominal tenderness.  Musculoskeletal:     Right shoulder: No swelling. Decreased range of motion.     Left shoulder: Normal.     Cervical back: Normal range of motion and neck supple.  Lymphadenopathy:     Cervical: No cervical adenopathy.  Skin:    General: Skin is warm and dry.  Neurological:     Mental Status: He is alert and oriented to person, place, and time.     Deep Tendon Reflexes: Reflexes are normal and symmetric.  Psychiatric:        Attention and Perception: Attention normal.        Mood and Affect: Mood normal.        Thought Content: Thought content normal.     Wt Readings from Last 3 Encounters:  01/06/21 224 lb (101.6 kg)  10/18/19 199 lb (90.3 kg)  09/20/18 204 lb 12.8 oz (92.9 kg)    BP 120/82   Pulse 72   Temp 97.9 F (36.6 C) (Oral)   Ht 5\' 8"  (1.727 m)   Wt 224 lb (101.6 kg)   SpO2 98%   BMI 34.06 kg/m   Assessment and Plan: 1. Annual physical exam Exam is normal except for weight. Encourage regular exercise and appropriate dietary changes. - CBC with Differential/Platelet - Comprehensive metabolic panel - Lipid panel  2. Need for  hepatitis C screening test - Hepatitis C antibody  3. OSA (obstructive sleep apnea) Doing well with good compliance and response to treatment  4. Chronic right shoulder pain Likely due to a rotator cuff tear sustained last summer Continue home exercises - if worsening again can refer to Orthopedics   Partially dictated using . Any errors are unintentional.  Animal nutritionist, MD [facility]  01/06/2021

## 2021-01-07 ENCOUNTER — Ambulatory Visit: Payer: Self-pay

## 2021-01-07 LAB — COMPREHENSIVE METABOLIC PANEL
ALT: 39 IU/L (ref 0–44)
AST: 26 IU/L (ref 0–40)
Albumin/Globulin Ratio: 1.9 (ref 1.2–2.2)
Albumin: 4.5 g/dL (ref 4.0–5.0)
Alkaline Phosphatase: 100 IU/L (ref 44–121)
BUN/Creatinine Ratio: 7 — ABNORMAL LOW (ref 9–20)
BUN: 6 mg/dL (ref 6–24)
Bilirubin Total: 0.4 mg/dL (ref 0.0–1.2)
CO2: 23 mmol/L (ref 20–29)
Calcium: 9.3 mg/dL (ref 8.7–10.2)
Chloride: 102 mmol/L (ref 96–106)
Creatinine, Ser: 0.89 mg/dL (ref 0.76–1.27)
GFR calc Af Amer: 120 mL/min/{1.73_m2} (ref >59)
GFR calc non Af Amer: 104 mL/min/{1.73_m2} (ref >59)
Globulin, Total: 2.4 g/dL (ref 1.5–4.5)
Glucose: 114 mg/dL — ABNORMAL HIGH (ref 65–99)
Potassium: 4.1 mmol/L (ref 3.5–5.2)
Sodium: 139 mmol/L (ref 134–144)
Total Protein: 6.9 g/dL (ref 6.0–8.5)

## 2021-01-07 LAB — LIPID PANEL
Chol/HDL Ratio: 4.9 ratio (ref 0.0–5.0)
Cholesterol, Total: 268 mg/dL — ABNORMAL HIGH (ref 100–199)
HDL: 55 mg/dL (ref >39)
LDL Chol Calc (NIH): 181 mg/dL — ABNORMAL HIGH (ref 0–99)
Triglycerides: 175 mg/dL — ABNORMAL HIGH (ref 0–149)
VLDL Cholesterol Cal: 32 mg/dL (ref 5–40)

## 2021-01-07 LAB — CBC WITH DIFFERENTIAL/PLATELET
Basophils Absolute: 0 10*3/uL (ref 0.0–0.2)
Basos: 1 %
EOS (ABSOLUTE): 0.3 10*3/uL (ref 0.0–0.4)
Eos: 5 %
Hematocrit: 42.8 % (ref 37.5–51.0)
Hemoglobin: 14.9 g/dL (ref 13.0–17.7)
Immature Grans (Abs): 0 10*3/uL (ref 0.0–0.1)
Immature Granulocytes: 0 %
Lymphocytes Absolute: 1.8 10*3/uL (ref 0.7–3.1)
Lymphs: 33 %
MCH: 30.6 pg (ref 26.6–33.0)
MCHC: 34.8 g/dL (ref 31.5–35.7)
MCV: 88 fL (ref 79–97)
Monocytes Absolute: 0.6 10*3/uL (ref 0.1–0.9)
Monocytes: 11 %
Neutrophils Absolute: 2.6 10*3/uL (ref 1.4–7.0)
Neutrophils: 50 %
Platelets: 270 10*3/uL (ref 150–450)
RBC: 4.87 x10E6/uL (ref 4.14–5.80)
RDW: 12.7 % (ref 11.6–15.4)
WBC: 5.2 10*3/uL (ref 3.4–10.8)

## 2021-01-07 LAB — HEPATITIS C ANTIBODY: Hep C Virus Ab: <0.1 s/co ratio (ref 0.0–0.9)

## 2021-01-07 NOTE — Telephone Encounter (Signed)
Pt given lab results per notes of Dr. Judithann Graves on 01/07/21. Pt verbalized understanding.

## 2021-01-11 LAB — HGB A1C W/O EAG: Hgb A1c MFr Bld: 5.5 % (ref 4.8–5.6)

## 2021-01-11 LAB — SPECIMEN STATUS REPORT

## 2021-08-27 ENCOUNTER — Other Ambulatory Visit: Payer: Self-pay

## 2021-08-27 ENCOUNTER — Encounter: Payer: Self-pay | Admitting: Internal Medicine

## 2021-08-27 ENCOUNTER — Ambulatory Visit (INDEPENDENT_AMBULATORY_CARE_PROVIDER_SITE_OTHER): Payer: No Typology Code available for payment source | Admitting: Internal Medicine

## 2021-08-27 VITALS — BP 140/82 | HR 76 | Temp 97.9°F | Ht 68.0 in | Wt 227.0 lb

## 2021-08-27 DIAGNOSIS — M778 Other enthesopathies, not elsewhere classified: Secondary | ICD-10-CM

## 2021-08-27 DIAGNOSIS — M62838 Other muscle spasm: Secondary | ICD-10-CM

## 2021-08-27 MED ORDER — CYCLOBENZAPRINE HCL 10 MG PO TABS: 10.0000 mg | ORAL_TABLET | Freq: Every day | ORAL | 0 refills | Status: DC

## 2021-08-27 MED ORDER — PREDNISONE 10 MG PO TABS: 10.0000 mg | ORAL_TABLET | ORAL | 0 refills | Status: AC

## 2021-08-27 NOTE — Progress Notes (Signed)
Date:  08/27/2021   Name:  Christian Chavez   DOB:  Aug 02, 1976   MRN:  [id number]   Chief Complaint: Back Pain (X3-4 weeks, shoulder blade on back, nagging bain, constant ) and Shoulder Pain (Over 1 years,Right shoulder,  pt fell a year ago now getting worse, nagging pain, keep pt up at night, goes down the back of pt right arm )  Shoulder Pain  The pain is present in the right shoulder. This is a chronic problem. The current episode started more than 1 year ago. The problem occurs daily. The problem has been unchanged. The pain is mild. He has tried NSAIDS and heat for the symptoms. The treatment provided mild relief.  Initially injured his shoulder last year in a fall - he caught himself as he slipped off of a ladder.  He did home exercises and his ROM is almost fully recovered.  Over the past month he has had discomfort in his upper back and trapezius muscle on the same side.  Some forearm tenderness noted as well.  No new injury.  Sleep is disrupted.  He has taken some Advil with modest benefit and use ice.  He has no weakness or numbness.  Lab Results  Component Value Date   CREATININE 0.89 01/06/2021   BUN 6 01/06/2021   NA 139 01/06/2021   K 4.1 01/06/2021   CL 102 01/06/2021   CO2 23 01/06/2021   Lab Results  Component Value Date   CHOL 268 (H) 01/06/2021   HDL 55 01/06/2021   LDLCALC 181 (H) 01/06/2021   TRIG 175 (H) 01/06/2021   CHOLHDL 4.9 01/06/2021   Lab Results  Component Value Date   TSH 1.330 09/20/2018   Lab Results  Component Value Date   HGBA1C 5.5 01/06/2021   Lab Results  Component Value Date   WBC 5.2 01/06/2021   HGB 14.9 01/06/2021   HCT 42.8 01/06/2021   MCV 88 01/06/2021   PLT 270 01/06/2021   Lab Results  Component Value Date   ALT 39 01/06/2021   AST 26 01/06/2021   ALKPHOS 100 01/06/2021   BILITOT 0.4 01/06/2021     Review of Systems  Constitutional:  Negative for chills and fatigue.  Respiratory:  Negative for chest tightness and  shortness of breath.   Cardiovascular:  Negative for chest pain.  Musculoskeletal:  Positive for arthralgias and myalgias.  Neurological:  Negative for dizziness and headaches.   Patient Active Problem List   Diagnosis Date Noted   Chronic right shoulder pain 01/06/2021   Intermittent palpitations 09/20/2018   Tinea corporis 04/27/2017   Hyperlipidemia, mixed 04/26/2017   Dysfunction of eustachian tube 12/04/2015   OSA (obstructive sleep apnea) 02/25/2015    No Known Allergies  Past Surgical History:  Procedure Laterality Date   APPENDECTOMY      Social History   Tobacco Use   Smoking status: Never   Smokeless tobacco: Never  Vaping Use   Vaping Use: Never used  Substance Use Topics   Alcohol use: Yes    Alcohol/week: 0.0 standard drinks    Comment: ocassional   Drug use: No     Medication list has been reviewed and updated.  Current Meds  Medication Sig   fluticasone (FLONASE) 50 MCG/ACT nasal spray Place 2 sprays into the nose as needed.   GINSENG PO Take by mouth daily.   NON FORMULARY CPAP auto nightly   OVER THE COUNTER MEDICATION daily. [employer]  PHQ 2/9 Scores 08/27/2021 01/06/2021 10/18/2019 09/20/2018  PHQ - 2 Score 0 2 0 0  PHQ- 9 Score 0 2 - -    GAD 7 : Generalized Anxiety Score 08/27/2021 01/06/2021  Nervous, Anxious, on Edge 0 0  Control/stop worrying 0 0  Worry too much - different things 0 0  Trouble relaxing 0 0  Restless 0 0  Easily annoyed or irritable 0 0  Afraid - awful might happen 0 0  Total GAD 7 Score 0 0  Anxiety Difficulty - Not difficult at all    BP Readings from Last 3 Encounters:  08/27/21 140/82  01/06/21 120/82  10/18/19 104/66    Physical Exam Vitals and nursing note reviewed.  Constitutional:      General: He is not in acute distress.    Appearance: Normal appearance. He is well-developed.  HENT:     Head: Normocephalic and atraumatic.  Cardiovascular:     Rate and Rhythm: Normal rate and regular rhythm.   Pulmonary:     Effort: Pulmonary effort is normal. No respiratory distress.     Breath sounds: No wheezing or rhonchi.  Musculoskeletal:     Right shoulder: Tenderness (over posterior upper arm) present. No swelling or deformity. Normal range of motion. Normal strength.     Cervical back: Normal range of motion. Tenderness (muscle spasm) present. No swelling or rigidity. Normal range of motion.  Lymphadenopathy:     Cervical: No cervical adenopathy.  Skin:    General: Skin is warm and dry.     Findings: No rash.  Neurological:     Mental Status: He is alert and oriented to person, place, and time.     Sensory: Sensation is intact.     Motor: Motor function is intact.     Deep Tendon Reflexes:     Reflex Scores:      Bicep reflexes are 2+ on the right side and 2+ on the left side. Psychiatric:        Mood and Affect: Mood normal.        Behavior: Behavior normal.    Wt Readings from Last 3 Encounters:  08/27/21 227 lb (103 kg)  01/06/21 224 lb (101.6 kg)  10/18/19 199 lb (90.3 kg)    BP 140/82   Pulse 76   Temp 97.9 F (36.6 C) (Oral)   Ht 5\' 8"  (1.727 m)   Wt 227 lb (103 kg)   SpO2 96%   BMI 34.52 kg/m   Assessment and Plan: 1. Tendonitis of shoulder, right Will give steroid taper Can continue heat/ice and topical rubs - predniSONE (DELTASONE) 10 MG tablet; Take 1 tablet (10 mg total) by mouth as directed for 6 days. Take 6,5,4,3,2,1 then stop  Dispense: 21 tablet; Refill: 0  2. Muscle spasms of neck Likely related to shoulder tendonitis Begin flexeril at bedtime - cyclobenzaprine (FLEXERIL) 10 MG tablet; Take 1 tablet (10 mg total) by mouth at bedtime.  Dispense: 30 tablet; Refill: 0  If sx are not mostly relieved by the above treatment, consider consultation with Dr. . Partially dictated using Ashley Royalty. Any errors are unintentional.  Animal nutritionist, MD [facility]  08/27/2021

## 2021-11-06 ENCOUNTER — Ambulatory Visit: Payer: Self-pay | Admitting: *Deleted

## 2021-11-06 NOTE — Telephone Encounter (Signed)
Pt called in c/o having cramping in his right forearm and the back of his arm.   Also numbness and tingling in his right fingers for the last 2-3 weeks that has been getting worse.   He injured his right shoulder a year ago but has not had it evaluated.   The pain that was in his right arm after the injury has gotten a lot better but the numbness and tingling is new as of 2-3 weeks ago.  The cramping only happens with certain motions.    He does a lot of over head work on his job.  See triage notes.  I made in an in office visit for 11/10/2021 at 11:00 with Dr. Judithann Graves.   Protocol is to be seen within 24 hours however since this is not an acute injury and this was the next available appt with Dr. Judithann Graves I made it for this time.   I instructed him to go to the ED if the numbness and tingling became worse or if he developed weakness in his right arm or if the cramps got worse.

## 2021-11-06 NOTE — Telephone Encounter (Signed)
Reason for Disposition  Numbness (i.e., loss of sensation) in hand or fingers  Answer Assessment - Initial Assessment Questions 1. ONST: "When did the pain start?"     Pt calling in c/o cramps in my right forearm and back of my forearm.    Numbness in back of my right arm and numbness in my fingers. Tingling started.   I injured my shoulder last year and have had pain since then.   The pain is better.  But numbness and cramping for the last 2-3 weeks. 2. LOCATION: "Where is the pain located?"     See above 3. PAIN: "How bad is the pain?" (Scale 1-10; or mild, moderate, severe)   - MILD (1-3): doesn't interfere with normal activities   - MODERATE (4-7): interferes with normal activities (e.g., work or school) or awakens from sleep   - SEVERE (8-10): excruciating pain, unable to do any normal activities, unable to hold a cup of water     Cramping forearm 3-4    Back of arm 5-6.   Only with certain motions causing the cramps.   If turn hand down my forearm cramps. 4. WORK OR EXERCISE: "Has there been any recent work or exercise that involved this part of the body?"     I've been working since the injury.  I do a lot of over head work.   5. CAUSE: "What do you think is causing the arm pain?"     The shoulder injury. 6. OTHER SYMPTOMS: "Do you have any other symptoms?" (e.g., neck pain, swelling, rash, fever, numbness, weakness)     Numbness, tingling in right arm and hand.   7. PREGNANCY: "Is there any chance you are pregnant?" "When was your last menstrual period?"     N/A  Protocols used: Arm Pain-A-AH

## 2021-11-10 ENCOUNTER — Encounter: Payer: Self-pay | Admitting: Internal Medicine

## 2021-11-10 ENCOUNTER — Ambulatory Visit
Admission: RE | Admit: 2021-11-10 | Discharge: 2021-11-10 | Disposition: A | Discharge status: Home / Self Care | Payer: No Typology Code available for payment source | Attending: Internal Medicine | Admitting: Internal Medicine

## 2021-11-10 ENCOUNTER — Ambulatory Visit (INDEPENDENT_AMBULATORY_CARE_PROVIDER_SITE_OTHER): Payer: No Typology Code available for payment source | Admitting: Internal Medicine

## 2021-11-10 ENCOUNTER — Ambulatory Visit
Admission: RE | Admit: 2021-11-10 | Discharge: 2021-11-10 | Disposition: A | Discharge status: Home / Self Care | Payer: No Typology Code available for payment source | Source: Ambulatory Visit | Attending: Internal Medicine | Admitting: Internal Medicine

## 2021-11-10 ENCOUNTER — Other Ambulatory Visit: Payer: Self-pay

## 2021-11-10 VITALS — BP 120/78 | HR 67 | Ht 68.0 in | Wt 227.2 lb

## 2021-11-10 DIAGNOSIS — G5601 Carpal tunnel syndrome, right upper limb: Secondary | ICD-10-CM

## 2021-11-10 DIAGNOSIS — M25511 Pain in right shoulder: Secondary | ICD-10-CM | POA: Diagnosis not present

## 2021-11-10 DIAGNOSIS — G8929 Other chronic pain: Secondary | ICD-10-CM

## 2021-11-10 DIAGNOSIS — Z23 Encounter for immunization: Secondary | ICD-10-CM

## 2021-11-10 NOTE — Progress Notes (Signed)
Date:  11/10/2021   Name:  Christian Chavez   DOB:  February 19, 1976   MRN:  [id number]   Chief Complaint: Shoulder Pain  Shoulder Pain  The pain is present in the right arm and right shoulder. This is a recurrent problem. The current episode started more than 1 year ago. There has been a history of trauma. The problem has been waxing and waning. The quality of the pain is described as aching (numbness, tingling). The pain is moderate. Associated symptoms include numbness (tingling in fingers on right).   Lab Results  Component Value Date   CREATININE 0.89 01/06/2021   BUN 6 01/06/2021   NA 139 01/06/2021   K 4.1 01/06/2021   CL 102 01/06/2021   CO2 23 01/06/2021   Lab Results  Component Value Date   CHOL 268 (H) 01/06/2021   HDL 55 01/06/2021   LDLCALC 181 (H) 01/06/2021   TRIG 175 (H) 01/06/2021   CHOLHDL 4.9 01/06/2021   Lab Results  Component Value Date   TSH 1.330 09/20/2018   Lab Results  Component Value Date   HGBA1C 5.5 01/06/2021   Lab Results  Component Value Date   WBC 5.2 01/06/2021   HGB 14.9 01/06/2021   HCT 42.8 01/06/2021   MCV 88 01/06/2021   PLT 270 01/06/2021   Lab Results  Component Value Date   ALT 39 01/06/2021   AST 26 01/06/2021   ALKPHOS 100 01/06/2021   BILITOT 0.4 01/06/2021     Review of Systems  Respiratory:  Negative for chest tightness and shortness of breath.   Cardiovascular:  Negative for chest pain.  Musculoskeletal:  Positive for arthralgias and myalgias (in right upper back/shoulder). Negative for gait problem and joint swelling.  Neurological:  Positive for numbness (tingling in fingers on right). Negative for dizziness, weakness and headaches.   Patient Active Problem List   Diagnosis Date Noted   Chronic right shoulder pain 01/06/2021   Intermittent palpitations 09/20/2018   Tinea corporis 04/27/2017   Hyperlipidemia, mixed 04/26/2017   Dysfunction of eustachian tube 12/04/2015   OSA (obstructive sleep apnea)  02/25/2015    No Known Allergies  Past Surgical History:  Procedure Laterality Date   APPENDECTOMY      Social History   Tobacco Use   Smoking status: Never   Smokeless tobacco: Never  Vaping Use   Vaping Use: Never used  Substance Use Topics   Alcohol use: Yes    Alcohol/week: 0.0 standard drinks    Comment: ocassional   Drug use: No     Medication list has been reviewed and updated.  Current Meds  Medication Sig   fluticasone (FLONASE) 50 MCG/ACT nasal spray Place 2 sprays into the nose as needed.   GINSENG PO Take by mouth daily.   NON FORMULARY CPAP auto nightly   OVER THE COUNTER MEDICATION daily. [employer]well   [DISCONTINUED] cyclobenzaprine (FLEXERIL) 10 MG tablet Take 1 tablet (10 mg total) by mouth at bedtime.    PHQ 2/9 Scores 11/10/2021 08/27/2021 01/06/2021 10/18/2019  PHQ - 2 Score 0 0 2 0  PHQ- 9 Score 0 0 2 -    GAD 7 : Generalized Anxiety Score 11/10/2021 08/27/2021 01/06/2021  Nervous, Anxious, on Edge 0 0 0  Control/stop worrying 0 0 0  Worry too much - different things 0 0 0  Trouble relaxing 0 0 0  Restless 0 0 0  Easily annoyed or irritable 0 0 0  Afraid - awful might  happen 0 0 0  Total GAD 7 Score 0 0 0  Anxiety Difficulty Not difficult at all - Not difficult at all    BP Readings from Last 3 Encounters:  11/10/21 120/78  08/27/21 140/82  01/06/21 120/82    Physical Exam Vitals and nursing note reviewed.  Constitutional:      General: He is not in acute distress.    Appearance: He is well-developed.  HENT:     Head: Normocephalic and atraumatic.  Cardiovascular:     Rate and Rhythm: Normal rate and regular rhythm.     Pulses: Normal pulses.  Pulmonary:     Effort: Pulmonary effort is normal. No respiratory distress.     Breath sounds: No wheezing or rhonchi.  Musculoskeletal:        General: Normal range of motion.     Right shoulder: Tenderness present. No bony tenderness or crepitus. Normal range of motion. Normal strength.      Cervical back: Normal range of motion.       Back:     Comments: Tinel's + on right Grip 5/5  Skin:    General: Skin is warm and dry.     Findings: No rash.  Neurological:     Mental Status: He is alert and oriented to person, place, and time.  Psychiatric:        Mood and Affect: Mood normal.        Behavior: Behavior normal.    Wt Readings from Last 3 Encounters:  11/10/21 227 lb 3.2 oz (103.1 kg)  08/27/21 227 lb (103 kg)  01/06/21 224 lb (101.6 kg)    BP 120/78   Pulse 67   Ht 5\' 8"  (1.727 m)   Wt 227 lb 3.2 oz (103.1 kg)   SpO2 99%   BMI 34.55 kg/m   Assessment and Plan: 1. Chronic right shoulder pain Resume Advil, add flexeril at bedtime Will get imaging and likely refer as indicated by the results - DG Shoulder Right  2. Carpal tunnel syndrome of right wrist Recommend wrist splint to be worn while sleeping   Partially dictated using . Any errors are unintentional.  Animal nutritionist, MD [facility]  11/10/2021

## 2021-11-10 NOTE — Patient Instructions (Signed)
Wrist splint while sleeping  Take Flexeril at bedtime  We will call you about the shoulder xray result.

## 2021-12-12 ENCOUNTER — Encounter: Payer: No Typology Code available for payment source | Admitting: Family Medicine

## 2022-01-08 ENCOUNTER — Encounter: Payer: No Typology Code available for payment source | Admitting: Internal Medicine

## 2022-01-29 ENCOUNTER — Other Ambulatory Visit: Payer: Self-pay

## 2022-01-29 ENCOUNTER — Ambulatory Visit (INDEPENDENT_AMBULATORY_CARE_PROVIDER_SITE_OTHER): Payer: No Typology Code available for payment source | Admitting: Internal Medicine

## 2022-01-29 ENCOUNTER — Encounter: Payer: Self-pay | Admitting: Internal Medicine

## 2022-01-29 VITALS — BP 114/82 | HR 75 | Ht 68.0 in | Wt 225.0 lb

## 2022-01-29 DIAGNOSIS — Z0000 Encounter for general adult medical examination without abnormal findings: Secondary | ICD-10-CM | POA: Diagnosis not present

## 2022-01-29 DIAGNOSIS — Z1211 Encounter for screening for malignant neoplasm of colon: Secondary | ICD-10-CM | POA: Diagnosis not present

## 2022-01-29 DIAGNOSIS — G4733 Obstructive sleep apnea (adult) (pediatric): Secondary | ICD-10-CM | POA: Diagnosis not present

## 2022-01-29 DIAGNOSIS — Z125 Encounter for screening for malignant neoplasm of prostate: Secondary | ICD-10-CM | POA: Diagnosis not present

## 2022-01-29 DIAGNOSIS — E782 Mixed hyperlipidemia: Secondary | ICD-10-CM | POA: Diagnosis not present

## 2022-01-29 DIAGNOSIS — G8929 Other chronic pain: Secondary | ICD-10-CM | POA: Diagnosis not present

## 2022-01-29 DIAGNOSIS — M25511 Pain in right shoulder: Secondary | ICD-10-CM

## 2022-01-29 NOTE — Progress Notes (Signed)
Date:  01/29/2022   Name:  Christian Chavez   DOB:  12-May-1976   MRN:  [id number]   Chief Complaint: Annual Exam Christian Chavez is a 46 y.o. male who presents today for his Complete Annual Exam. He feels well. He reports exercising none. He reports he is sleeping well. He has OSA and uses CPAP and tolerating well. Using more than 4 hours per night.   Colonoscopy: none - due  Immunization History  Administered Date(s) Administered   Influenza,inj,Quad PF,6+ Mos 11/10/2021   Influenza-Unspecified 09/17/2020   PFIZER(Purple Top)SARS-COV-2 Vaccination 03/27/2020, 04/17/2020, 12/24/2020   Tdap 10/18/2019    No results found for: PSA1, PSA   HPI  Lab Results  Component Value Date   NA 139 01/06/2021   K 4.1 01/06/2021   CO2 23 01/06/2021   GLUCOSE 114 (H) 01/06/2021   BUN 6 01/06/2021   CREATININE 0.89 01/06/2021   CALCIUM 9.3 01/06/2021   GFRNONAA 104 01/06/2021   Lab Results  Component Value Date   CHOL 268 (H) 01/06/2021   HDL 55 01/06/2021   LDLCALC 181 (H) 01/06/2021   TRIG 175 (H) 01/06/2021   CHOLHDL 4.9 01/06/2021   Lab Results  Component Value Date   TSH 1.330 09/20/2018   Lab Results  Component Value Date   HGBA1C 5.5 01/06/2021   Lab Results  Component Value Date   WBC 5.2 01/06/2021   HGB 14.9 01/06/2021   HCT 42.8 01/06/2021   MCV 88 01/06/2021   PLT 270 01/06/2021   Lab Results  Component Value Date   ALT 39 01/06/2021   AST 26 01/06/2021   ALKPHOS 100 01/06/2021   BILITOT 0.4 01/06/2021   No results found for: 25OHVITD2, 25OHVITD3, VD25OH   Review of Systems  Constitutional:  Negative for appetite change, chills, diaphoresis, fatigue and unexpected weight change.  HENT:  Negative for hearing loss, tinnitus, trouble swallowing and voice change.   Eyes:  Negative for visual disturbance.  Respiratory:  Negative for choking, shortness of breath and wheezing.   Cardiovascular:  Negative for chest pain, palpitations and leg swelling.   Gastrointestinal:  Negative for abdominal pain, blood in stool, constipation and diarrhea.  Genitourinary:  Negative for difficulty urinating, dysuria and frequency.  Musculoskeletal:  Positive for arthralgias (right shoulder) and neck pain. Negative for back pain and myalgias.  Skin:  Negative for color change and rash.  Neurological:  Negative for dizziness, syncope and headaches.  Hematological:  Negative for adenopathy.  Psychiatric/Behavioral:  Negative for dysphoric mood and sleep disturbance. The patient is not nervous/anxious.    Patient Active Problem List   Diagnosis Date Noted   Chronic right shoulder pain 01/06/2021   Intermittent palpitations 09/20/2018   Hyperlipidemia, mixed 04/26/2017   Dysfunction of eustachian tube 12/04/2015   OSA (obstructive sleep apnea) 02/25/2015    No Known Allergies  Past Surgical History:  Procedure Laterality Date   APPENDECTOMY      Social History   Tobacco Use   Smoking status: Never   Smokeless tobacco: Never  Vaping Use   Vaping Use: Never used  Substance Use Topics   Alcohol use: Yes    Alcohol/week: 0.0 standard drinks    Comment: ocassional   Drug use: No     Medication list has been reviewed and updated.  Current Meds  Medication Sig   fluticasone (FLONASE) 50 MCG/ACT nasal spray Place 2 sprays into the nose as needed.   GINSENG PO Take by mouth daily.  NON FORMULARY CPAP auto nightly   OVER THE COUNTER MEDICATION daily. Zenwell    PHQ 2/9 Scores 01/29/2022 11/10/2021 08/27/2021 01/06/2021  PHQ - 2 Score 0 0 0 2  PHQ- 9 Score 0 0 0 2    GAD 7 : Generalized Anxiety Score 01/29/2022 11/10/2021 08/27/2021 01/06/2021  Nervous, Anxious, on Edge 0 0 0 0  Control/stop worrying 0 0 0 0  Worry too much - different things 0 0 0 0  Trouble relaxing 0 0 0 0  Restless 0 0 0 0  Easily annoyed or irritable 0 0 0 0  Afraid - awful might happen 0 0 0 0  Total GAD 7 Score 0 0 0 0  Anxiety Difficulty - Not difficult at all -  Not difficult at all    BP Readings from Last 3 Encounters:  01/29/22 114/82  11/10/21 120/78  08/27/21 140/82    Physical Exam Vitals and nursing note reviewed.  Constitutional:      Appearance: Normal appearance. He is well-developed.  HENT:     Head: Normocephalic.     Right Ear: Tympanic membrane, ear canal and external ear normal.     Left Ear: Tympanic membrane, ear canal and external ear normal.     Nose: Nose normal.  Eyes:     Conjunctiva/sclera: Conjunctivae normal.     Pupils: Pupils are equal, round, and reactive to light.  Neck:     Thyroid: No thyromegaly.     Vascular: No carotid bruit.  Cardiovascular:     Rate and Rhythm: Normal rate and regular rhythm.     Heart sounds: Normal heart sounds.  Pulmonary:     Effort: Pulmonary effort is normal.     Breath sounds: Normal breath sounds. No wheezing.  Chest:  Breasts:    Right: No mass.     Left: No mass.  Abdominal:     General: Bowel sounds are normal.     Palpations: Abdomen is soft.     Tenderness: There is no abdominal tenderness.  Musculoskeletal:        General: Normal range of motion.     Cervical back: Normal range of motion and neck supple.  Lymphadenopathy:     Cervical: No cervical adenopathy.  Skin:    General: Skin is warm and dry.  Neurological:     Mental Status: He is alert and oriented to person, place, and time.     Deep Tendon Reflexes: Reflexes are normal and symmetric.  Psychiatric:        Attention and Perception: Attention normal.        Mood and Affect: Mood normal.        Thought Content: Thought content normal.    Wt Readings from Last 3 Encounters:  01/29/22 225 lb (102.1 kg)  11/10/21 227 lb 3.2 oz (103.1 kg)  08/27/21 227 lb (103 kg)    BP 114/82    Pulse 75    Ht 5\' 8"  (1.727 m)    Wt 225 lb (102.1 kg)    SpO2 97%    BMI 34.21 kg/m   Assessment and Plan: 1. Annual physical exam Exam is normal except for weight. Encourage regular exercise and appropriate  dietary changes. Up to date on screenings and immunizations. - CBC with Differential/Platelet - Comprehensive metabolic panel  2. Colon cancer screening Declines today - will consider next year.  3. Prostate cancer screening DRE deferred - PSA  4. Hyperlipidemia, mixed Has been moderately elevated but with  low 10 yr risk - Lipid panel  5. OSA (obstructive sleep apnea) Doing well with nightly CPAP treatment.  6. Chronic right shoulder pain S/p fall last year. Recent imaging showed mild glenohumeral degeneration.  Now having some tingling in the fingers of the right hand. Recommend evaluation with [facility] Dr. Zigmund Daniel.   Partially dictated using Editor, commissioning. Any errors are unintentional.  Halina Maidens, MD [facility]  01/29/2022

## 2022-01-30 ENCOUNTER — Encounter: Payer: Self-pay | Admitting: Family Medicine

## 2022-01-30 ENCOUNTER — Ambulatory Visit (INDEPENDENT_AMBULATORY_CARE_PROVIDER_SITE_OTHER): Payer: No Typology Code available for payment source | Admitting: Family Medicine

## 2022-01-30 VITALS — BP 116/82 | HR 87 | Ht 68.0 in | Wt 225.0 lb

## 2022-01-30 DIAGNOSIS — M75101 Unspecified rotator cuff tear or rupture of right shoulder, not specified as traumatic: Secondary | ICD-10-CM | POA: Insufficient documentation

## 2022-01-30 DIAGNOSIS — G5621 Lesion of ulnar nerve, right upper limb: Secondary | ICD-10-CM | POA: Diagnosis not present

## 2022-01-30 LAB — LIPID PANEL
Chol/HDL Ratio: 5.4 ratio — ABNORMAL HIGH (ref 0.0–5.0)
Cholesterol, Total: 238 mg/dL — ABNORMAL HIGH (ref 100–199)
HDL: 44 mg/dL (ref >39)
LDL Chol Calc (NIH): 153 mg/dL — ABNORMAL HIGH (ref 0–99)
Triglycerides: 223 mg/dL — ABNORMAL HIGH (ref 0–149)
VLDL Cholesterol Cal: 41 mg/dL — ABNORMAL HIGH (ref 5–40)

## 2022-01-30 LAB — COMPREHENSIVE METABOLIC PANEL
ALT: 54 IU/L — ABNORMAL HIGH (ref 0–44)
AST: 33 IU/L (ref 0–40)
Albumin/Globulin Ratio: 2.2 (ref 1.2–2.2)
Albumin: 4.8 g/dL (ref 4.0–5.0)
Alkaline Phosphatase: 101 IU/L (ref 44–121)
BUN/Creatinine Ratio: 7 — ABNORMAL LOW (ref 9–20)
BUN: 7 mg/dL (ref 6–24)
Bilirubin Total: 0.6 mg/dL (ref 0.0–1.2)
CO2: 26 mmol/L (ref 20–29)
Calcium: 9.3 mg/dL (ref 8.7–10.2)
Chloride: 103 mmol/L (ref 96–106)
Creatinine, Ser: 0.96 mg/dL (ref 0.76–1.27)
Globulin, Total: 2.2 g/dL (ref 1.5–4.5)
Glucose: 101 mg/dL — ABNORMAL HIGH (ref 70–99)
Potassium: 4.7 mmol/L (ref 3.5–5.2)
Sodium: 142 mmol/L (ref 134–144)
Total Protein: 7 g/dL (ref 6.0–8.5)
eGFR: 99 mL/min/{1.73_m2} (ref >59)

## 2022-01-30 LAB — CBC WITH DIFFERENTIAL/PLATELET
Basophils Absolute: 0 10*3/uL (ref 0.0–0.2)
Basos: 0 %
EOS (ABSOLUTE): 0.3 10*3/uL (ref 0.0–0.4)
Eos: 5 %
Hematocrit: 43.8 % (ref 37.5–51.0)
Hemoglobin: 15 g/dL (ref 13.0–17.7)
Immature Grans (Abs): 0 10*3/uL (ref 0.0–0.1)
Immature Granulocytes: 0 %
Lymphocytes Absolute: 2.1 10*3/uL (ref 0.7–3.1)
Lymphs: 31 %
MCH: 30.2 pg (ref 26.6–33.0)
MCHC: 34.2 g/dL (ref 31.5–35.7)
MCV: 88 fL (ref 79–97)
Monocytes Absolute: 0.7 10*3/uL (ref 0.1–0.9)
Monocytes: 10 %
Neutrophils Absolute: 3.6 10*3/uL (ref 1.4–7.0)
Neutrophils: 54 %
Platelets: 287 10*3/uL (ref 150–450)
RBC: 4.97 x10E6/uL (ref 4.14–5.80)
RDW: 12.8 % (ref 11.6–15.4)
WBC: 6.7 10*3/uL (ref 3.4–10.8)

## 2022-01-30 LAB — PSA: Prostate Specific Ag, Serum: 0.7 ng/mL (ref 0.0–4.0)

## 2022-01-30 MED ORDER — MELOXICAM 15 MG PO TABS: 15.0000 mg | ORAL_TABLET | Freq: Every day | ORAL | 0 refills | Status: DC

## 2022-01-30 NOTE — Patient Instructions (Signed)
-   Start meloxicam once daily with food x2 weeks (no other NSAIDs while on this medication) - After 2 weeks, have a low threshold to continue meloxicam daily for any shoulder pain noted - After 1 week, start home exercises with the materials provided, do not push through pain - Can continue gentle range of motion exercises, ice, and can use heat - Limit activity to nonstrenuous tasks whenever possible - Return for follow-up in 4 weeks, contact us for any questions between now and then

## 2022-01-30 NOTE — Assessment & Plan Note (Signed)
Ongoing for several weeks, secondary to 1-1/2-year history of right shoulder pain, anticipate this to improve as right shoulder returns to full painless function.  If persistently symptomatic, we can consider gabapentin.

## 2022-01-30 NOTE — Assessment & Plan Note (Signed)
Right-hand-dominant patient presenting with roughly 1-1/2-year of right superolateral shoulder pain.  Onset was traumatic involving a fall off of a ladder, during this he reached out and grabbed a rafter with his right arm, was suspended and there followed by a fall to his right arm.  Denied any bruising about the right upper extremity, swelling noted initially, this was also associated with significant decrease in range of motion due to pain.  Over the course of his symptoms he has utilized ice, ibuprofen as needed, home stretches, and was treated by his PCP Dr. Judithann Graves with prednisone and skeletal muscle relaxer, he is uncertain about response.  He has noted over the past several weeks numbness to his right fifth digit to the elbow, tightness with pain about the right neck and into the right pectoralis laterally.  Physical examination reveals full painful range of motion of the right shoulder, resisted rotator cuff testing elicits 5/5 strength with internal rotation and external rotation, external rotation is mildly painful, isolated supraspinatus is maximally painful and demonstrates 5 -/5 strength, he has positive impingement and recreation of right fifth digit paresthesias with Tinel's at his ulnar groove at the elbow.  He is mildly tender about the right paraspinal musculature, subacromial space, provocative testing otherwise is benign.  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  Additionally, his x-rays do reveal cortical roughening at the greater tuberosity and subacromial sclerosis which can be considered consistent with an element of chronic impingement.  Plan for scheduled meloxicam x2 weeks then as needed, home-based rehab to commence in 1 week, and close follow-up in 4 weeks.  We can advance to  ultrasound-guided injections and/or advanced imaging if suboptimal progress noted.  Chronic condition, Rx management, x-ray review

## 2022-01-30 NOTE — Progress Notes (Signed)
Primary Care / Sports Medicine Office Visit  Patient Information:  Patient ID: Christian Chavez, male DOB: 08-07-1976 Age: 46 y.o. MRN: [id number]   Christian Chavez is a pleasant 46 y.o. male presenting with the following:  Chief Complaint  Patient presents with   New Patient (Initial Visit)   Shoulder Pain    Right; X-Ray 11/10/21; fell off a ladder 12-14 feet high 1.5 years ago and struck right arm on the way to the ground; describes pain as cramping, constant numbness and tingling down to fingertips, burning; treatments include stretching, ibuprofen, and ice with some relief; worse on exacerbation and at the end of the day; right-handedness; 2/10 pain    Vitals:   01/30/22 0835  BP: 116/82  Pulse: 87  SpO2: 97%   Vitals:   01/30/22 0835  Weight: 225 lb (102.1 kg)  Height: 5\' 8"  (1.727 m)   Body mass index is 34.21 kg/m.  No results found.   Independent interpretation of notes and tests performed by another provider:   Independent interpretation of right shoulder x-rays from 11/10/2021 reveals mild subacromial sclerosis, glenohumeral cortical roughening consistent with degenerative changes primarily at the glenoid, additionally, cortical roughening at the greater tuberosity of the humerus, no acute osseous process identified  Procedures performed:   None  Pertinent History, Exam, Impression, and Recommendations:   Supraspinatus syndrome of right shoulder Right-hand-dominant patient presenting with roughly 1-1/2-year of right superolateral shoulder pain.  Onset was traumatic involving a fall off of a ladder, during this he reached out and grabbed a rafter with his right arm, was suspended and there followed by a fall to his right arm.  Denied any bruising about the right upper extremity, swelling noted initially, this was also associated with significant decrease in range of motion due to pain.  Over the course of his symptoms he has utilized ice, ibuprofen as needed, home  stretches, and was treated by his PCP Dr. 11/12/2021 with prednisone and skeletal muscle relaxer, he is uncertain about response.  He has noted over the past several weeks numbness to his right fifth digit to the elbow, tightness with pain about the right neck and into the right pectoralis laterally.  Physical examination reveals full painful range of motion of the right shoulder, resisted rotator cuff testing elicits 5/5 strength with internal rotation and external rotation, external rotation is mildly painful, isolated supraspinatus is maximally painful and demonstrates 5 -/5 strength, he has positive impingement and recreation of right fifth digit paresthesias with Tinel's at his ulnar groove at the elbow.  He is mildly tender about the right paraspinal musculature, subacromial space, provocative testing otherwise is benign.  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  Additionally, his x-rays do reveal cortical roughening at the greater tuberosity and subacromial sclerosis which can be considered consistent with an element of chronic impingement.  Plan for scheduled meloxicam x2 weeks then as needed, home-based rehab to commence in 1 week, and close follow-up in 4 weeks.  We can advance to ultrasound-guided injections and/or advanced imaging if suboptimal progress noted.  Chronic condition, Rx management, x-ray review  Ulnar neuropathy at elbow, right Ongoing for several weeks, secondary to 1-1/2-year history of right shoulder pain, anticipate this to improve as right shoulder returns to full painless function.  If persistently symptomatic, we can consider gabapentin.    Orders &  Medications Meds ordered this encounter  Medications   meloxicam (MOBIC) 15 MG tablet    Sig: Take 1 tablet (15 mg total)  by mouth daily.    Dispense:  30 tablet    Refill:  0   No orders of the defined types were placed in this encounter.    Return in about 4 weeks (around 02/27/2022).     Jerrol Banana, MD   Primary Care Sports Medicine [facility]

## 2022-02-26 ENCOUNTER — Ambulatory Visit: Payer: No Typology Code available for payment source | Admitting: Internal Medicine

## 2023-02-02 ENCOUNTER — Encounter: Payer: Self-pay | Admitting: Internal Medicine

## 2023-02-02 ENCOUNTER — Ambulatory Visit (INDEPENDENT_AMBULATORY_CARE_PROVIDER_SITE_OTHER): Payer: No Typology Code available for payment source | Admitting: Internal Medicine

## 2023-02-02 VITALS — BP 134/78 | HR 81 | Ht 68.0 in | Wt 221.2 lb

## 2023-02-02 DIAGNOSIS — G4733 Obstructive sleep apnea (adult) (pediatric): Secondary | ICD-10-CM | POA: Diagnosis not present

## 2023-02-02 DIAGNOSIS — Z131 Encounter for screening for diabetes mellitus: Secondary | ICD-10-CM

## 2023-02-02 DIAGNOSIS — Z1211 Encounter for screening for malignant neoplasm of colon: Secondary | ICD-10-CM | POA: Diagnosis not present

## 2023-02-02 DIAGNOSIS — E782 Mixed hyperlipidemia: Secondary | ICD-10-CM

## 2023-02-02 DIAGNOSIS — Z125 Encounter for screening for malignant neoplasm of prostate: Secondary | ICD-10-CM

## 2023-02-02 DIAGNOSIS — Z0000 Encounter for general adult medical examination without abnormal findings: Secondary | ICD-10-CM

## 2023-02-02 DIAGNOSIS — Z23 Encounter for immunization: Secondary | ICD-10-CM

## 2023-02-02 NOTE — Progress Notes (Signed)
Date:  02/02/2023   Name:  Christian Chavez   DOB:  04/01/1976   MRN:  [id number]   Chief Complaint: Annual Exam Christian Chavez is a 47 y.o. male who presents today for his Complete Annual Exam. He feels well. He reports exercising some. He reports he is sleeping well.   Colonoscopy: none  Immunization History  Administered Date(s) Administered   Influenza,inj,Quad PF,6+ Mos 11/10/2021, 02/02/2023   Influenza-Unspecified 09/17/2020   PFIZER(Purple Top)SARS-COV-2 Vaccination 03/27/2020, 04/17/2020, 12/24/2020   Tdap 10/18/2019   Health Maintenance Due  Topic Date Due   HIV Screening  Never done   COLONOSCOPY (Pts 45-77yrs Insurance coverage will need to be confirmed)  Never done   COVID-19 Vaccine (4 - 2023-24 season) 08/28/2022    Lab Results  Component Value Date   PSA1 0.7 01/29/2022   Hyperlipidemia This is a chronic problem. Pertinent negatives include no chest pain, myalgias or shortness of breath. Current antihyperlipidemic treatment includes diet change.  OSA - using CPAP nightly and sleeps well.   He wakes refreshed and feels benefit from the device.   Lab Results  Component Value Date   NA 142 01/29/2022   K 4.7 01/29/2022   CO2 26 01/29/2022   GLUCOSE 101 (H) 01/29/2022   BUN 7 01/29/2022   CREATININE 0.96 01/29/2022   CALCIUM 9.3 01/29/2022   EGFR 99 01/29/2022   GFRNONAA 104 01/06/2021   Lab Results  Component Value Date   CHOL 238 (H) 01/29/2022   HDL 44 01/29/2022   LDLCALC 153 (H) 01/29/2022   TRIG 223 (H) 01/29/2022   CHOLHDL 5.4 (H) 01/29/2022   Lab Results  Component Value Date   TSH 1.330 09/20/2018   Lab Results  Component Value Date   HGBA1C 5.5 01/06/2021   Lab Results  Component Value Date   WBC 6.7 01/29/2022   HGB 15.0 01/29/2022   HCT 43.8 01/29/2022   MCV 88 01/29/2022   PLT 287 01/29/2022   Lab Results  Component Value Date   ALT 54 (H) 01/29/2022   AST 33 01/29/2022   ALKPHOS 101 01/29/2022   BILITOT 0.6 01/29/2022    No results found for: "25OHVITD2", "25OHVITD3", "VD25OH"   Review of Systems  Constitutional:  Negative for appetite change, chills, diaphoresis, fatigue and unexpected weight change.  HENT:  Negative for hearing loss, tinnitus, trouble swallowing and voice change.   Eyes:  Negative for visual disturbance.  Respiratory:  Negative for choking, shortness of breath and wheezing.   Cardiovascular:  Positive for palpitations (occasionally). Negative for chest pain and leg swelling.  Gastrointestinal:  Negative for abdominal pain, blood in stool, constipation and diarrhea.  Genitourinary:  Negative for difficulty urinating, dysuria and frequency.  Musculoskeletal:  Positive for arthralgias (knee pain intermittently). Negative for back pain and myalgias.  Skin:  Negative for color change and rash.  Neurological:  Negative for dizziness, syncope and headaches.  Hematological:  Negative for adenopathy.  Psychiatric/Behavioral:  Negative for dysphoric mood and sleep disturbance. The patient is not nervous/anxious.     Patient Active Problem List   Diagnosis Date Noted   Supraspinatus syndrome of right shoulder 01/30/2022   Ulnar neuropathy at elbow, right 01/30/2022   Chronic right shoulder pain 01/06/2021   Intermittent palpitations 09/20/2018   Hyperlipidemia, mixed 04/26/2017   Dysfunction of eustachian tube 12/04/2015   OSA (obstructive sleep apnea) 02/25/2015    No Known Allergies  Past Surgical History:  Procedure Laterality Date   APPENDECTOMY  Social History   Tobacco Use   Smoking status: Never   Smokeless tobacco: Never  Vaping Use   Vaping Use: Never used  Substance Use Topics   Alcohol use: Yes   Drug use: Never     Medication list has been reviewed and updated.  Current Meds  Medication Sig   fluticasone (FLONASE) 50 MCG/ACT nasal spray Place 2 sprays into the nose as needed.   GINSENG PO Take 1 capsule by mouth daily.   NON FORMULARY CPAP auto nightly    OVER THE COUNTER MEDICATION Take 1 capsule by mouth daily. Zenwell   [DISCONTINUED] meloxicam (MOBIC) 15 MG tablet Take 1 tablet (15 mg total) by mouth daily.       02/02/2023    8:01 AM 01/30/2022    8:54 AM 01/29/2022   10:36 AM 11/10/2021   11:01 AM  GAD 7 : Generalized Anxiety Score  Nervous, Anxious, on Edge 0 0 0 0  Control/stop worrying 0 0 0 0  Worry too much - different things 0 0 0 0  Trouble relaxing 0 0 0 0  Restless 0 0 0 0  Easily annoyed or irritable 0 0 0 0  Afraid - awful might happen 0 0 0 0  Total GAD 7 Score 0 0 0 0  Anxiety Difficulty Not difficult at all Not difficult at all  Not difficult at all       02/02/2023    8:01 AM 01/30/2022    8:54 AM 01/29/2022   10:36 AM  Depression screen PHQ 2/9  Decreased Interest 0 0 0  Down, Depressed, Hopeless 0 0 0  PHQ - 2 Score 0 0 0  Altered sleeping 0 0 0  Tired, decreased energy 0 0 0  Change in appetite 0 0 0  Feeling bad or failure about yourself  0 0 0  Trouble concentrating 0 0 0  Moving slowly or fidgety/restless 0 0 0  Suicidal thoughts 0 0 0  PHQ-9 Score 0 0 0  Difficult doing work/chores Not difficult at all Not difficult at all     BP Readings from Last 3 Encounters:  02/02/23 134/78  01/30/22 116/82  01/29/22 114/82    Physical Exam Vitals and nursing note reviewed.  Constitutional:      Appearance: Normal appearance. He is well-developed.  HENT:     Head: Normocephalic.     Right Ear: Tympanic membrane, ear canal and external ear normal.     Left Ear: Ear canal and external ear normal. Tympanic membrane is retracted.     Nose: Nose normal.  Eyes:     Conjunctiva/sclera: Conjunctivae normal.     Pupils: Pupils are equal, round, and reactive to light.  Neck:     Thyroid: No thyromegaly.     Vascular: No carotid bruit.  Cardiovascular:     Rate and Rhythm: Normal rate and regular rhythm.     Pulses: Normal pulses.     Heart sounds: Normal heart sounds. No murmur heard. Pulmonary:      Effort: Pulmonary effort is normal.     Breath sounds: Normal breath sounds. No wheezing.  Chest:  Breasts:    Right: No mass.     Left: No mass.  Abdominal:     General: Bowel sounds are normal.     Palpations: Abdomen is soft.     Tenderness: There is no abdominal tenderness.  Musculoskeletal:        General: Normal range of motion.  Cervical back: Normal range of motion and neck supple.     Right lower leg: No edema.     Left lower leg: No edema.  Lymphadenopathy:     Cervical: No cervical adenopathy.  Skin:    General: Skin is warm and dry.     Capillary Refill: Capillary refill takes less than 2 seconds.  Neurological:     General: No focal deficit present.     Mental Status: He is alert and oriented to person, place, and time.     Deep Tendon Reflexes: Reflexes are normal and symmetric.  Psychiatric:        Attention and Perception: Attention normal.        Mood and Affect: Mood normal.        Thought Content: Thought content normal.     Wt Readings from Last 3 Encounters:  02/02/23 221 lb 3.2 oz (100.3 kg)  01/30/22 225 lb (102.1 kg)  01/29/22 225 lb (102.1 kg)    BP 134/78 (BP Location: Left Arm, Cuff Size: Normal)   Pulse 81   Ht 5\' 8"  (1.727 m)   Wt 221 lb 3.2 oz (100.3 kg)   SpO2 97%   BMI 33.63 kg/m   Assessment and Plan: Problem List Items Addressed This Visit       Respiratory   OSA (obstructive sleep apnea)    Excellent compliance with use nightly Sleeps well, wakes refreshed. He is clearly benefiting from ongoing use.       Relevant Orders   CBC with Differential/Platelet     Other   Hyperlipidemia, mixed   Relevant Orders   Lipid panel   Other Visit Diagnoses     Annual physical exam    -  Primary   continue healthy diet and exercise monitor BP and return if persistently high DASH diet   Relevant Orders   CBC with Differential/Platelet   Comprehensive metabolic panel   Hemoglobin A1c   Lipid panel   PSA   Colon cancer  screening       declines screening at this time   Screening for diabetes mellitus       Relevant Orders   Hemoglobin A1c   Prostate cancer screening       DRE deferred to lack of symptoms Pat GF had prostate cancer   Relevant Orders   PSA   Need for immunization against influenza       Relevant Orders   Flu Vaccine QUAD 42mo+IM (Fluarix, Fluzone & Alfiuria Quad PF) (Completed)        Partially dictated using Editor, commissioning. Any errors are unintentional.  Halina Maidens, MD [facility]  02/02/2023

## 2023-02-02 NOTE — Assessment & Plan Note (Signed)
Excellent compliance with use nightly Sleeps well, wakes refreshed. He is clearly benefiting from ongoing use.

## 2023-02-03 LAB — CBC WITH DIFFERENTIAL/PLATELET
Basophils Absolute: 0 10*3/uL (ref 0.0–0.2)
Basos: 0 %
EOS (ABSOLUTE): 0.3 10*3/uL (ref 0.0–0.4)
Eos: 5 %
Hematocrit: 43 % (ref 37.5–51.0)
Hemoglobin: 14.6 g/dL (ref 13.0–17.7)
Immature Grans (Abs): 0 10*3/uL (ref 0.0–0.1)
Immature Granulocytes: 0 %
Lymphocytes Absolute: 1.6 10*3/uL (ref 0.7–3.1)
Lymphs: 26 %
MCH: 30.4 pg (ref 26.6–33.0)
MCHC: 34 g/dL (ref 31.5–35.7)
MCV: 90 fL (ref 79–97)
Monocytes Absolute: 0.6 10*3/uL (ref 0.1–0.9)
Monocytes: 9 %
Neutrophils Absolute: 3.7 10*3/uL (ref 1.4–7.0)
Neutrophils: 60 %
Platelets: 312 10*3/uL (ref 150–450)
RBC: 4.8 x10E6/uL (ref 4.14–5.80)
RDW: 12.4 % (ref 11.6–15.4)
WBC: 6.3 10*3/uL (ref 3.4–10.8)

## 2023-02-03 LAB — COMPREHENSIVE METABOLIC PANEL
ALT: 34 IU/L (ref 0–44)
AST: 26 IU/L (ref 0–40)
Albumin/Globulin Ratio: 2 (ref 1.2–2.2)
Albumin: 4.6 g/dL (ref 4.1–5.1)
Alkaline Phosphatase: 108 IU/L (ref 44–121)
BUN/Creatinine Ratio: 13 (ref 9–20)
BUN: 12 mg/dL (ref 6–24)
Bilirubin Total: <0.2 mg/dL (ref 0.0–1.2)
CO2: 19 mmol/L — ABNORMAL LOW (ref 20–29)
Calcium: 8.9 mg/dL (ref 8.7–10.2)
Chloride: 103 mmol/L (ref 96–106)
Creatinine, Ser: 0.89 mg/dL (ref 0.76–1.27)
Globulin, Total: 2.3 g/dL (ref 1.5–4.5)
Glucose: 118 mg/dL — ABNORMAL HIGH (ref 70–99)
Potassium: 4.4 mmol/L (ref 3.5–5.2)
Sodium: 140 mmol/L (ref 134–144)
Total Protein: 6.9 g/dL (ref 6.0–8.5)
eGFR: 107 mL/min/{1.73_m2} (ref >59)

## 2023-02-03 LAB — HEMOGLOBIN A1C
Est. average glucose Bld gHb Est-mCnc: 114 mg/dL
Hgb A1c MFr Bld: 5.6 % (ref 4.8–5.6)

## 2023-02-03 LAB — LIPID PANEL
Chol/HDL Ratio: 6.9 ratio — ABNORMAL HIGH (ref 0.0–5.0)
Cholesterol, Total: 275 mg/dL — ABNORMAL HIGH (ref 100–199)
HDL: 40 mg/dL (ref >39)
LDL Chol Calc (NIH): 101 mg/dL — ABNORMAL HIGH (ref 0–99)
Triglycerides: 774 mg/dL (ref 0–149)
VLDL Cholesterol Cal: 134 mg/dL — ABNORMAL HIGH (ref 5–40)

## 2023-02-03 LAB — PSA: Prostate Specific Ag, Serum: 0.7 ng/mL (ref 0.0–4.0)

## 2023-08-16 ENCOUNTER — Ambulatory Visit: Payer: No Typology Code available for payment source | Admitting: Internal Medicine

## 2023-09-01 ENCOUNTER — Ambulatory Visit (INDEPENDENT_AMBULATORY_CARE_PROVIDER_SITE_OTHER): Payer: No Typology Code available for payment source | Admitting: Internal Medicine

## 2023-09-01 ENCOUNTER — Other Ambulatory Visit: Payer: Self-pay | Admitting: Internal Medicine

## 2023-09-01 ENCOUNTER — Other Ambulatory Visit
Admission: RE | Admit: 2023-09-01 | Discharge: 2023-09-01 | Disposition: A | Discharge status: Home / Self Care | Payer: No Typology Code available for payment source | Attending: Internal Medicine | Admitting: Internal Medicine

## 2023-09-01 ENCOUNTER — Encounter: Payer: Self-pay | Admitting: Internal Medicine

## 2023-09-01 VITALS — BP 124/78 | HR 76 | Ht 68.0 in | Wt 213.0 lb

## 2023-09-01 DIAGNOSIS — Z23 Encounter for immunization: Secondary | ICD-10-CM

## 2023-09-01 DIAGNOSIS — E782 Mixed hyperlipidemia: Secondary | ICD-10-CM

## 2023-09-01 LAB — LIPID PANEL
Cholesterol: 257 mg/dL — ABNORMAL HIGH (ref 0–200)
HDL: 54 mg/dL (ref >40)
LDL Cholesterol: 167 mg/dL — ABNORMAL HIGH (ref 0–99)
Total CHOL/HDL Ratio: 4.8 ratio
Triglycerides: 180 mg/dL — ABNORMAL HIGH (ref <150)
VLDL: 36 mg/dL (ref 0–40)

## 2023-09-01 NOTE — Progress Notes (Signed)
Date:  09/01/2023   Name:  Christian Chavez   DOB:  [birth year]   MRN:  [id number]   Chief Complaint: Hyperlipidemia (TGs were elevated last visit- needs repeat. )  Hyperlipidemia This is a chronic problem. The problem is uncontrolled. Recent lipid tests were reviewed and are high. Pertinent negatives include no chest pain or shortness of breath. Current antihyperlipidemic treatment includes diet change and herbal therapy. The current treatment provides moderate improvement of lipids.    Lab Results  Component Value Date   NA 140 02/02/2023   K 4.4 02/02/2023   CO2 19 (L) 02/02/2023   GLUCOSE 118 (H) 02/02/2023   BUN 12 02/02/2023   CREATININE 0.89 02/02/2023   CALCIUM 8.9 02/02/2023   EGFR 107 02/02/2023   GFRNONAA 104 01/06/2021   Lab Results  Component Value Date   CHOL 275 (H) 02/02/2023   HDL 40 02/02/2023   LDLCALC 101 (H) 02/02/2023   TRIG 774 (HH) 02/02/2023   CHOLHDL 6.9 (H) 02/02/2023   Lab Results  Component Value Date   TSH 1.330 09/20/2018   Lab Results  Component Value Date   HGBA1C 5.6 02/02/2023   Lab Results  Component Value Date   WBC 6.3 02/02/2023   HGB 14.6 02/02/2023   HCT 43.0 02/02/2023   MCV 90 02/02/2023   PLT 312 02/02/2023   Lab Results  Component Value Date   ALT 34 02/02/2023   AST 26 02/02/2023   ALKPHOS 108 02/02/2023   BILITOT <0.2 02/02/2023   No results found for: "25OHVITD2", "25OHVITD3", "VD25OH"   Review of Systems  Constitutional:  Negative for fatigue and unexpected weight change.  HENT:  Negative for nosebleeds.   Eyes:  Negative for visual disturbance.  Respiratory:  Negative for cough, chest tightness, shortness of breath and wheezing.   Cardiovascular:  Negative for chest pain, palpitations and leg swelling.  Gastrointestinal:  Negative for abdominal pain, constipation and diarrhea.  Neurological:  Negative for dizziness, weakness, light-headedness and headaches.    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  Current Meds  Medication Sig   fluticasone (FLONASE) 50 MCG/ACT nasal spray Place 2 sprays into the nose as needed.   GINSENG PO Take 1 capsule by mouth daily.   NON FORMULARY CPAP auto nightly   Omega-3 Fatty Acids (FISH OIL) 1000 MG CAPS Take 2,000 mg by mouth daily at 6 (six) AM.   OVER THE COUNTER MEDICATION Take 1 capsule by mouth daily. Zenwell       09/01/2023    9:47 AM 02/02/2023    8:01 AM 01/30/2022    8:54 AM 01/29/2022   10:36 AM  GAD 7 : Generalized Anxiety Score  Nervous, Anxious, on Edge 0 0 0 0  Control/stop worrying 0 0 0 0  Worry too much - different things 0 0 0 0  Trouble relaxing 0 0 0 0  Restless 0 0 0 0  Easily annoyed or irritable 0 0 0 0  Afraid - awful might happen 0 0 0 0  Total GAD 7 Score 0 0 0 0  Anxiety Difficulty Not difficult at all Not difficult at all Not difficult at all        09/01/2023    9:47 AM 02/02/2023    8:01 AM 01/30/2022    8:54 AM  Depression screen PHQ 2/9  Decreased Interest 0 0 0  Down, Depressed, Hopeless 0 0 0  PHQ - 2 Score 0 0 0  Altered sleeping 0 0 0  Tired, decreased energy 0 0 0  Change in appetite 0 0 0  Feeling bad or failure about yourself  0 0 0  Trouble concentrating 0 0 0  Moving slowly or fidgety/restless 0 0 0  Suicidal thoughts 0 0 0  PHQ-9 Score 0 0 0  Difficult  doing work/chores Not difficult at all Not difficult at all Not difficult at all    BP Readings from Last 3 Encounters:  09/01/23 124/78  02/02/23 134/78  01/30/22 116/82    Physical Exam Vitals and nursing note reviewed.  Constitutional:      General: He is not in acute distress.    Appearance: He is well-developed.  HENT:     Head: Normocephalic and atraumatic.  Neck:     Vascular: No carotid bruit.  Cardiovascular:     Rate and Rhythm: Normal rate and regular rhythm.     Pulses: Normal pulses.     Heart sounds: No murmur heard. Pulmonary:     Effort: Pulmonary effort is normal. No respiratory distress.     Breath sounds: No wheezing or rhonchi.  Musculoskeletal:     Cervical back: Normal range of motion.     Right lower leg: No edema.     Left lower leg: No edema.  Lymphadenopathy:     Cervical: No cervical adenopathy.  Skin:    General: Skin is warm and dry.     Findings: No rash.  Neurological:     General: No focal deficit present.     Mental Status: He is alert and oriented to person, place, and time.  Psychiatric:        Mood and Affect: Mood normal.        Behavior: Behavior normal.     Wt Readings from Last 3 Encounters:  09/01/23 213 lb (96.6 kg)  02/02/23 221 lb 3.2 oz (100.3 kg)  01/30/22 225 lb (102.1 kg)    BP 124/78   Pulse 76   Ht 5\' 8"  (1.727 m)   Wt 213 lb (96.6 kg)   SpO2 98%   BMI 32.39 kg/m   Assessment and Plan:  Problem List Items Addressed This Visit       Unprioritized   Hyperlipidemia, mixed - Primary    Managed with diet but triglycerides very high last check Taking high quality fish oil supplement and continued diet changes Will recheck labs today      Relevant Orders   Lipid panel   Other Visit Diagnoses     Need for influenza vaccination       Relevant Orders   Flu vaccine trivalent PF, 6mos and older(Flulaval,Afluria,Fluarix,Fluzone) (Completed)       No follow-ups on file.    Reubin Milan,  MD [facility] [place]

## 2023-09-01 NOTE — Assessment & Plan Note (Addendum)
Managed with diet but triglycerides very high last check Taking high quality fish oil supplement and continued diet changes Will recheck labs today

## 2024-02-14 ENCOUNTER — Ambulatory Visit (INDEPENDENT_AMBULATORY_CARE_PROVIDER_SITE_OTHER): Payer: No Typology Code available for payment source | Admitting: Internal Medicine

## 2024-02-14 ENCOUNTER — Encounter: Payer: Self-pay | Admitting: Internal Medicine

## 2024-02-14 VITALS — BP 132/78 | HR 94 | Ht 68.0 in | Wt 219.0 lb

## 2024-02-14 DIAGNOSIS — Z125 Encounter for screening for malignant neoplasm of prostate: Secondary | ICD-10-CM

## 2024-02-14 DIAGNOSIS — Z1211 Encounter for screening for malignant neoplasm of colon: Secondary | ICD-10-CM | POA: Diagnosis not present

## 2024-02-14 DIAGNOSIS — E782 Mixed hyperlipidemia: Secondary | ICD-10-CM | POA: Diagnosis not present

## 2024-02-14 DIAGNOSIS — G4733 Obstructive sleep apnea (adult) (pediatric): Secondary | ICD-10-CM

## 2024-02-14 DIAGNOSIS — R002 Palpitations: Secondary | ICD-10-CM

## 2024-02-14 DIAGNOSIS — Z0000 Encounter for general adult medical examination without abnormal findings: Secondary | ICD-10-CM

## 2024-02-14 DIAGNOSIS — Z131 Encounter for screening for diabetes mellitus: Secondary | ICD-10-CM

## 2024-02-14 NOTE — Assessment & Plan Note (Signed)
Managed with diet and omega-3 supplements

## 2024-02-14 NOTE — Progress Notes (Signed)
Date:  02/14/2024   Name:  Christian Chavez   DOB:  15-May-1976   MRN:  [id number]   Chief Complaint: Annual Exam Christian Chavez is a 48 y.o. male who presents today for his Complete Annual Exam. He feels well. He reports exercising - some. He reports he is sleeping well.   Health Maintenance  Topic Date Due   COVID-19 Vaccine (4 - 2024-25 season) 08/29/2023   Colon Cancer Screening  08/31/2024*   HIV Screening  08/31/2024*   DTaP/Tdap/Td vaccine (2 - Td or Tdap) 10/17/2029   Flu Shot  Completed   Hepatitis C Screening  Completed   HPV Vaccine  Aged Out  *Topic was postponed. The date shown is not the original due date.    Lab Results  Component Value Date   PSA1 0.7 02/02/2023   PSA1 0.7 01/29/2022    HPI OSA - using CPAP nightly with benefit.  Palpitations are much less and shorter in duration.   He feels well.   Review of Systems  Constitutional:  Negative for fatigue and unexpected weight change.  Eyes:  Negative for visual disturbance.  Respiratory:  Negative for cough, chest tightness, shortness of breath and wheezing.   Cardiovascular:  Negative for chest pain, palpitations and leg swelling.  Gastrointestinal:  Negative for abdominal pain, constipation and diarrhea.  Neurological:  Negative for dizziness, weakness, light-headedness and headaches.     Lab Results  Component Value Date   NA 140 02/02/2023   K 4.4 02/02/2023   CO2 19 (L) 02/02/2023   GLUCOSE 118 (H) 02/02/2023   BUN 12 02/02/2023   CREATININE 0.89 02/02/2023   CALCIUM 8.9 02/02/2023   EGFR 107 02/02/2023   GFRNONAA 104 01/06/2021   Lab Results  Component Value Date   CHOL 257 (H) 09/01/2023   HDL 54 09/01/2023   LDLCALC 167 (H) 09/01/2023   TRIG 180 (H) 09/01/2023   CHOLHDL 4.8 09/01/2023   Lab Results  Component Value Date   TSH 1.330 09/20/2018   Lab Results  Component Value Date   HGBA1C 5.6 02/02/2023   Lab Results  Component Value Date   WBC 6.3 02/02/2023   HGB 14.6  02/02/2023   HCT 43.0 02/02/2023   MCV 90 02/02/2023   PLT 312 02/02/2023   Lab Results  Component Value Date   ALT 34 02/02/2023   AST 26 02/02/2023   ALKPHOS 108 02/02/2023   BILITOT <0.2 02/02/2023   No results found for: "25OHVITD2", "25OHVITD3", "VD25OH"   Patient Active Problem List   Diagnosis Date Noted   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  Current Meds  Medication Sig   fluticasone (FLONASE) 50 MCG/ACT nasal spray Place 2 sprays into the nose as needed.   GINSENG PO Take 1 capsule by mouth daily.   NON FORMULARY CPAP auto  nightly   Omega-3 Fatty Acids (FISH OIL) 1000 MG CAPS Take 2,000 mg by mouth daily at 6 (six) AM.   OVER THE COUNTER MEDICATION Take 1 capsule by mouth daily. [employer]well       02/14/2024    8:14 AM 09/01/2023    9:47 AM 02/02/2023    8:01 AM 01/30/2022    8:54 AM  GAD 7 : Generalized Anxiety Score  Nervous, Anxious, on Edge 0 0 0 0  Control/stop worrying 0 0 0 0  Worry too much - different things 0 0 0 0  Trouble relaxing 0 0 0 0  Restless 0 0 0 0  Easily annoyed or irritable 0 0 0 0  Afraid - awful might happen 0 0 0 0  Total GAD 7 Score 0 0 0 0  Anxiety Difficulty Not difficult at all Not difficult at all Not difficult at all Not difficult at all       02/14/2024    8:14 AM 09/01/2023    9:47 AM 02/02/2023    8:01 AM  Depression screen PHQ 2/9   Decreased Interest 0 0 0  Down, Depressed, Hopeless 0 0 0  PHQ - 2 Score 0 0 0  Altered sleeping 0 0 0  Tired, decreased energy 0 0 0  Change in appetite 0 0 0  Feeling bad or failure about yourself  0 0 0  Trouble concentrating 0 0 0  Moving slowly or fidgety/restless 0 0 0  Suicidal thoughts 0 0 0  PHQ-9 Score 0 0 0  Difficult doing work/chores Not difficult at all Not difficult at all Not difficult at all    BP Readings from Last 3 Encounters:  02/14/24 132/78  09/01/23 124/78  02/02/23 134/78    Physical Exam Vitals and nursing note reviewed.  Constitutional:      Appearance: Normal appearance. He is well-developed.  HENT:     Head: Normocephalic.     Right Ear: Tympanic membrane, ear canal and external ear normal.     Left Ear: Tympanic membrane, ear canal and external ear normal.     Nose: Nose normal.  Eyes:     Conjunctiva/sclera: Conjunctivae normal.     Pupils: Pupils are equal, round, and reactive to light.  Neck:     Thyroid: No thyromegaly.     Vascular: No carotid bruit.  Cardiovascular:     Rate and Rhythm: Normal rate and regular rhythm.     Heart sounds: Normal heart sounds.  Pulmonary:     Effort: Pulmonary effort is normal.     Breath sounds: Normal breath sounds. No wheezing.  Chest:  Breasts:    Right: No mass.     Left: No mass.  Abdominal:     General: Bowel sounds are normal.     Palpations: Abdomen is soft.     Tenderness: There is no abdominal tenderness.  Musculoskeletal:        General: Normal range of motion.     Cervical back: Normal range of motion and neck supple.  Lymphadenopathy:     Cervical: No cervical adenopathy.  Skin:    General: Skin is warm and dry.  Neurological:     Mental Status: He is alert and oriented to person, place, and time.     Deep Tendon Reflexes: Reflexes are normal and symmetric.  Psychiatric:        Attention and Perception: Attention normal.        Mood and Affect: Mood normal.  Thought  Content: Thought content normal.     Wt Readings from Last 3 Encounters:  02/14/24 219 lb (99.3 kg)  09/01/23 213 lb (96.6 kg)  02/02/23 221 lb 3.2 oz (100.3 kg)    BP 132/78   Pulse 94   Ht 5\' 8"  (1.727 m)   Wt 219 lb (99.3 kg)   SpO2 100%   BMI 33.30 kg/m   Assessment and Plan:  Problem List Items Addressed This Visit       Unprioritized   Hyperlipidemia, mixed   Managed with diet and omega-3 supplements      Relevant Orders   Lipid panel   OSA (obstructive sleep apnea)   Benefits from nightly use with restful sleep and no daytime somnolence.      Relevant Orders   CBC with Differential/Platelet   Comprehensive metabolic panel   Intermittent palpitations   Much reduced since treatment of sleep apnea.      Other Visit Diagnoses       Annual physical exam    -  Primary   Relevant Orders   CBC with Differential/Platelet   Comprehensive metabolic panel   Hemoglobin A1c   Lipid panel   PSA     Colon cancer screening       he prefers to postpone for now due to lack of symptoms/family hx     Prostate cancer screening       Relevant Orders   PSA     Screening for diabetes mellitus       Relevant Orders   Hemoglobin A1c       No follow-ups on file.    Reubin Milan, MD [facility] [place]

## 2024-02-14 NOTE — Assessment & Plan Note (Signed)
Much reduced since treatment of sleep apnea.

## 2024-02-14 NOTE — Patient Instructions (Signed)
Osteobioflex with boswellia/tumeric/curcumin

## 2024-02-14 NOTE — Assessment & Plan Note (Signed)
Benefits from nightly use with restful sleep and no daytime somnolence.

## 2024-02-16 ENCOUNTER — Encounter: Payer: Self-pay | Admitting: Internal Medicine

## 2024-02-16 LAB — COMPREHENSIVE METABOLIC PANEL
ALT: 46 [IU]/L — ABNORMAL HIGH (ref 0–44)
AST: 37 [IU]/L (ref 0–40)
Albumin: 4.7 g/dL (ref 4.1–5.1)
Alkaline Phosphatase: 112 [IU]/L (ref 44–121)
BUN/Creatinine Ratio: 13 (ref 9–20)
BUN: 11 mg/dL (ref 6–24)
Bilirubin Total: <0.2 mg/dL (ref 0.0–1.2)
CO2: 22 mmol/L (ref 20–29)
Calcium: 9.2 mg/dL (ref 8.7–10.2)
Chloride: 104 mmol/L (ref 96–106)
Creatinine, Ser: 0.83 mg/dL (ref 0.76–1.27)
Globulin, Total: 2.2 g/dL (ref 1.5–4.5)
Glucose: 108 mg/dL — ABNORMAL HIGH (ref 70–99)
Potassium: 5.2 mmol/L (ref 3.5–5.2)
Sodium: 142 mmol/L (ref 134–144)
Total Protein: 6.9 g/dL (ref 6.0–8.5)
eGFR: 108 mL/min/{1.73_m2} (ref >59)

## 2024-02-16 LAB — PSA: Prostate Specific Ag, Serum: 0.6 ng/mL (ref 0.0–4.0)

## 2024-02-16 LAB — CBC WITH DIFFERENTIAL/PLATELET
Basophils Absolute: 0.1 10*3/uL (ref 0.0–0.2)
Basos: 1 %
EOS (ABSOLUTE): 0.3 10*3/uL (ref 0.0–0.4)
Eos: 5 %
Hematocrit: 46.3 % (ref 37.5–51.0)
Hemoglobin: 15.4 g/dL (ref 13.0–17.7)
Immature Grans (Abs): 0 10*3/uL (ref 0.0–0.1)
Immature Granulocytes: 1 %
Lymphocytes Absolute: 1.9 10*3/uL (ref 0.7–3.1)
Lymphs: 29 %
MCH: 30.8 pg (ref 26.6–33.0)
MCHC: 33.3 g/dL (ref 31.5–35.7)
MCV: 93 fL (ref 79–97)
Monocytes Absolute: 0.7 10*3/uL (ref 0.1–0.9)
Monocytes: 10 %
Neutrophils Absolute: 3.6 10*3/uL (ref 1.4–7.0)
Neutrophils: 54 %
Platelets: 294 10*3/uL (ref 150–450)
RBC: 5 x10E6/uL (ref 4.14–5.80)
RDW: 13.1 % (ref 11.6–15.4)
WBC: 6.5 10*3/uL (ref 3.4–10.8)

## 2024-02-16 LAB — LIPID PANEL
Chol/HDL Ratio: 4.3 {ratio} (ref 0.0–5.0)
Cholesterol, Total: 239 mg/dL — ABNORMAL HIGH (ref 100–199)
HDL: 55 mg/dL (ref >39)
LDL Chol Calc (NIH): 152 mg/dL — ABNORMAL HIGH (ref 0–99)
Triglycerides: 177 mg/dL — ABNORMAL HIGH (ref 0–149)
VLDL Cholesterol Cal: 32 mg/dL (ref 5–40)

## 2024-02-16 LAB — HEMOGLOBIN A1C
Est. average glucose Bld gHb Est-mCnc: 111 mg/dL
Hgb A1c MFr Bld: 5.5 % (ref 4.8–5.6)

## 2024-02-16 LAB — SPECIMEN STATUS REPORT

## 2025-02-15 ENCOUNTER — Encounter: Payer: No Typology Code available for payment source | Admitting: Physician Assistant
